# Patient Record
Sex: Male | Born: 1950 | Race: White | Hispanic: No | Marital: Married | Smoking: Never smoker
Health system: Southern US, Community
[De-identification: ages and names within clinical notes are randomized; demographics above are authoritative.]

## PROBLEM LIST (undated history)

## (undated) DIAGNOSIS — M1990 Unspecified osteoarthritis, unspecified site: Secondary | ICD-10-CM

## (undated) DIAGNOSIS — C44310 Basal cell carcinoma of skin of unspecified parts of face: Secondary | ICD-10-CM

## (undated) DIAGNOSIS — C801 Malignant (primary) neoplasm, unspecified: Secondary | ICD-10-CM

## (undated) DIAGNOSIS — R39198 Other difficulties with micturition: Secondary | ICD-10-CM

## (undated) DIAGNOSIS — K759 Inflammatory liver disease, unspecified: Secondary | ICD-10-CM

## (undated) HISTORY — PX: TONSILLECTOMY: SUR1361

## (undated) HISTORY — PX: PILONIDAL CYST EXCISION: SHX744

## (undated) HISTORY — PX: OTHER SURGICAL HISTORY: SHX169

---

## 2008-11-14 ENCOUNTER — Encounter: Admission: RE | Admit: 2008-11-14 | Discharge: 2008-11-14 | Payer: Self-pay | Admitting: Family Medicine

## 2014-09-11 ENCOUNTER — Ambulatory Visit: Payer: Self-pay | Admitting: Orthopedic Surgery

## 2014-09-11 NOTE — Progress Notes (Signed)
Preoperative surgical orders have been place into the Epic hospital system for Lisa Roca on 09/11/2014, 9:34 AM  by Mickel Crow for surgery on 09-28-2014.  Preop Total Hip - Anterior Approach orders including IV Tylenol, and IV Decadron as long as there are no contraindications to the above medications. Arlee Muslim, PA-C

## 2014-09-14 NOTE — Pre-Procedure Instructions (Signed)
Kirk Price  09/14/2014   Your procedure is scheduled on:  Friday, May 27th   Report to [facility] Admitting at 7:30 AM.   Call this number if you have problems the morning of surgery: [phone]   Remember:   Do not eat food or drink liquids after midnight Thursday.   Take these medicines the morning of surgery with A SIP OF WATER: nothing.  You will need to stop taking ANY anti-inflammatories 4-5 days prior to surgery.   Do not wear jewelry-no rings or watches.  Do not wear lotions or colognes. You may NOT wear deodorant the day of surgery.   Men may shave face and neck.   Do not bring valuables to the hospital.  [facility] is not responsible for any belongings or valuables.               Contacts, dentures or bridgework may not be worn into surgery.  Leave suitcase in the car. After surgery it may be brought to your room.  For patients admitted to the hospital, discharge time is determined by your treatment team.              Name and phone number of your driver:             Special Instructions: "Preparing for Surgery" instruction sheet.   Please read over the following fact sheets that you were given: Pain Booklet, Coughing and Deep Breathing, Blood Transfusion Information, MRSA Information and Surgical Site Infection Prevention

## 2014-09-17 ENCOUNTER — Encounter (HOSPITAL_COMMUNITY): Payer: Self-pay

## 2014-09-17 ENCOUNTER — Encounter (HOSPITAL_COMMUNITY)
Admission: RE | Admit: 2014-09-17 | Discharge: 2014-09-17 | Disposition: A | Discharge status: Home / Self Care | Payer: BLUE CROSS/BLUE SHIELD | Source: Ambulatory Visit | Attending: Orthopedic Surgery | Admitting: Orthopedic Surgery

## 2014-09-17 DIAGNOSIS — R001 Bradycardia, unspecified: Secondary | ICD-10-CM | POA: Insufficient documentation

## 2014-09-17 DIAGNOSIS — M1611 Unilateral primary osteoarthritis, right hip: Secondary | ICD-10-CM | POA: Diagnosis not present

## 2014-09-17 DIAGNOSIS — Z01818 Encounter for other preprocedural examination: Secondary | ICD-10-CM | POA: Diagnosis present

## 2014-09-17 DIAGNOSIS — Z01812 Encounter for preprocedural laboratory examination: Secondary | ICD-10-CM | POA: Diagnosis not present

## 2014-09-17 HISTORY — DX: Inflammatory liver disease, unspecified: K75.9

## 2014-09-17 HISTORY — DX: Unspecified osteoarthritis, unspecified site: M19.90

## 2014-09-17 HISTORY — DX: Malignant (primary) neoplasm, unspecified: C80.1

## 2014-09-17 LAB — URINALYSIS, ROUTINE W REFLEX MICROSCOPIC
Bilirubin Urine: NEGATIVE
GLUCOSE, UA: NEGATIVE mg/dL
Hgb urine dipstick: NEGATIVE
Ketones, ur: NEGATIVE mg/dL
LEUKOCYTES UA: NEGATIVE
NITRITE: NEGATIVE
PH: 5 (ref 5.0–8.0)
Protein, ur: NEGATIVE mg/dL
SPECIFIC GRAVITY, URINE: 1.026 (ref 1.005–1.030)
Urobilinogen, UA: 0.2 mg/dL (ref 0.0–1.0)

## 2014-09-17 LAB — CBC
HEMATOCRIT: 47.6 % (ref 39.0–52.0)
HEMOGLOBIN: 15.6 g/dL (ref 13.0–17.0)
MCH: 30.6 pg (ref 26.0–34.0)
MCHC: 32.8 g/dL (ref 30.0–36.0)
MCV: 93.3 fL (ref 78.0–100.0)
Platelets: 253 10*3/uL (ref 150–400)
RBC: 5.1 MIL/uL (ref 4.22–5.81)
RDW: 12.9 % (ref 11.5–15.5)
WBC: 6.6 10*3/uL (ref 4.0–10.5)

## 2014-09-17 LAB — COMPREHENSIVE METABOLIC PANEL
ALBUMIN: 3.9 g/dL (ref 3.5–5.0)
ALK PHOS: 92 U/L (ref 38–126)
ALT: 19 U/L (ref 17–63)
ANION GAP: 10 (ref 5–15)
AST: 21 U/L (ref 15–41)
BUN: 18 mg/dL (ref 6–20)
CALCIUM: 9.5 mg/dL (ref 8.9–10.3)
CO2: 27 mmol/L (ref 22–32)
CREATININE: 1.07 mg/dL (ref 0.61–1.24)
Chloride: 108 mmol/L (ref 101–111)
GFR calc non Af Amer: >60 mL/min (ref >60)
GLUCOSE: 93 mg/dL (ref 65–99)
POTASSIUM: 5 mmol/L (ref 3.5–5.1)
Sodium: 145 mmol/L (ref 135–145)
TOTAL PROTEIN: 6.2 g/dL — AB (ref 6.5–8.1)
Total Bilirubin: 0.5 mg/dL (ref 0.3–1.2)

## 2014-09-17 LAB — PROTIME-INR
INR: 1.02 (ref 0.00–1.49)
Prothrombin Time: 13.5 seconds (ref 11.6–15.2)

## 2014-09-17 LAB — SURGICAL PCR SCREEN
MRSA, PCR: NEGATIVE
Staphylococcus aureus: NEGATIVE

## 2014-09-17 LAB — APTT: APTT: 56 s — AB (ref 24–37)

## 2014-09-17 NOTE — Progress Notes (Addendum)
Patient would prefer NOT to wear blood band x 11 days.  He understands he will get another blood sample draw DOS. Denies any cardiac problems.  Has never seen cardio.  His PCP is Dr. Jetty Duhamel @ [place].   DA

## 2014-09-18 ENCOUNTER — Encounter (HOSPITAL_COMMUNITY): Payer: Self-pay

## 2014-09-18 NOTE — Progress Notes (Signed)
Anesthesia Chart Review:  Pt is 64 year old male scheduled for R total hip arthroplasty on 09/28/2014 with Dr. Wynelle Link.   PMH includes: basal cell skin cancer, arthritis. Never smoker. BMI 28.  Medications include: ibuprofen.   Preoperative labs reviewed. PTT 56. Called and faxed results to [facility] in Dr. Anne Fu office. Will repeat DOS.   EKG: sinus bradycardia (52 bpm)  Carotid duplex US 11/14/2008: Minimal carotid atherosclerosis. No hemodynamically significant ICA stenosis on either side  If lab results acceptable DOS, I anticipate pt can proceed as scheduled.   Willeen Cass, FNP-BC [facility] Short Stay Surgical Center/Anesthesiology Phone: [phone] 09/18/2014 2:51 PM

## 2014-09-21 ENCOUNTER — Ambulatory Visit: Payer: Self-pay | Admitting: Orthopedic Surgery

## 2014-09-21 NOTE — H&P (Signed)
Kirk Price DOB: 08/31/1950 Married / Language: English / Race: White Male Date of Admission:  09/28/2014 CC:  Right Hip Pain History of Present Illness The patient is a 64 year old male who comes in for a preoperative History and Physical. The patient is scheduled for a right total hip arthroplasty (anterior) to be performed by Dr. Dione Plover. Aluisio, MD at [facility] on 09-28-2014. The patient reports right hip problems including pain symptoms that have been present for 4 year(s). The symptoms began without any known injury. Symptoms reported include hip pain (groin pain, constant dull pain, more acute with certain movements) and night pain (with lying in certain positions) Previous treatment for this problem has included corticosteroid injection (intraarticular) and nonsteroidal anti-inflammatory drugs. He said the right hip is starting to hurt with all activities. Occasionally, he will have pain at rest also. It is starting to limit him significantly. 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 Unfortunately, he is not playing tennis anymore. He is having a hard time getting around just doing regular activities. He occasionally will have pain at night. His motion has become very limited. He currently is not having any problems with his left hip, but has been told he has some left hip arthritis. Dr. Delilah Shan has previously given intraarticular cortisone injection, which did not help. He has had some benefit from Mobic in the past, but does not want to take any regular medications. He has advanced end stage arthritis of the right hip with progressively pain and dysfunction. At this point, the most predictable means of improving pain and function will be total hip arthroplasty. He is ready to proceed with surgery. They have been treated conservatively in the past for the above stated problem and despite conservative measures, they continue to have  progressive pain and severe functional limitations and dysfunction. They have failed non-operative management including home exercise, medications, and injections. It is felt that they would benefit from undergoing total joint replacement. Risks and benefits of the procedure have been discussed with the patient and they elect to proceed with surgery. There are no active contraindications to surgery such as ongoing infection or rapidly progressive neurological disease.  Problem List/Past Medical Primary osteoarthritis of right hip (M16.11) Leg pain (M79.606) Skin Cancer Hypercholesterolemia Chronic Pain  Allergies  No Known Drug Allergies  Family History  Cancer Brother, Father.  Social History  Exercise Exercises weekly; does individual sport Current work status working full time Living situation live with spouse Children 2 No history of drug/alcohol rehab Number of flights of stairs before winded 4-5 Not under pain contract Marital status married Current drinker 06/22/2013: Currently drinks beer and wine only occasionally per week Tobacco use Never smoker. 06/22/2013 Tobacco / smoke exposure 06/22/2013: no  Medication History No Current Medications  Past Surgical History Basal Cell - Facial Lesion Removed  Review of Systems General Not Present- Chills, Fatigue, Fever, Memory Loss, Night Sweats, Weight Gain and Weight Loss. Skin Not Present- Eczema, Hives, Itching, Lesions and Rash. HEENT Not Present- Dentures, Double Vision, Headache, Hearing Loss, Tinnitus and Visual Loss. Respiratory Not Present- Allergies, Chronic Cough, Coughing up blood, Shortness of breath at rest and Shortness of breath with exertion. Cardiovascular Not Present- Chest Pain, Difficulty Breathing Lying Down, Murmur, Palpitations, Racing/skipping heartbeats and Swelling. Gastrointestinal Not Present- Abdominal Pain, Bloody Stool, Constipation, Diarrhea, Difficulty Swallowing, Heartburn,  Jaundice, Loss of appetitie, Nausea and Vomiting. Male Genitourinary Present- Urinating at  Night. Not Present- Blood in Urine, Discharge, Flank Pain, Incontinence, Painful Urination, Urgency, Urinary frequency, Urinary Retention and Weak urinary stream. Musculoskeletal Present- Joint Pain. Not Present- Back Pain, Joint Swelling, Morning Stiffness, Muscle Pain, Muscle Weakness and Spasms. Neurological Not Present- Blackout spells, Difficulty with balance, Dizziness, Paralysis, Tremor and Weakness. Psychiatric Not Present- Insomnia.  Vitals Weight: 192 lb Height: 71in Weight was reported by patient. Body Surface Area: 2.07 m Body Mass Index: 26.78 kg/m  BP: 128/78 (Sitting, Right Arm, Standard)   Physical Exam General Mental Status -Alert, cooperative and good historian. General Appearance-pleasant, Not in acute distress. Orientation-Oriented X3. Build & Nutrition-Well nourished and Well developed.  Head and Neck Head-normocephalic, atraumatic . Neck Global Assessment - supple, no bruit auscultated on the right, no bruit auscultated on the left.  Eye Vision-Wears corrective lenses(readers only). Pupil - Bilateral-Regular and Round. Motion - Bilateral-EOMI.  Chest and Lung Exam Auscultation Breath sounds - clear at anterior chest wall and clear at posterior chest wall. Adventitious sounds - No Adventitious sounds.  Cardiovascular Auscultation Rhythm - Regular rate and rhythm. Heart Sounds - S1 WNL and S2 WNL. Murmurs & Other Heart Sounds - Auscultation of the heart reveals - No Murmurs.  Abdomen Palpation/Percussion Tenderness - Abdomen is non-tender to palpation. Rigidity (guarding) - Abdomen is soft. Auscultation Auscultation of the abdomen reveals - Bowel sounds normal.  Male Genitourinary Note: Not done, not pertinent to present illness   Musculoskeletal Note: On exam, he is alert and oriented and in no apparent distress. Evaluation of  his left hip shows flexion to 130, rotation in 30, out 40, and abduction 40 without discomfort. His right hip flexion is 90, no internal rotation, approximately 10 degrees of external rotation, and 20 degrees of abduction. His knee exam is normal bilaterally. Pulses, sensation, and motor are intact bilaterally. He has a slightly antalgic gait pattern.  RADIOGRAPHS: AP pelvis and lateral of the right hip taken in December shows severe bone on bone arthritis with subchondral cystic formation and a small amount of flattening of the femoral head.   Assessment & Plan  Primary osteoarthritis of right hip (M16.11) Note:Surgical Plans: Right Total Hip Replacement - Anterior Approach  Disposition: Home  PCP: Dr. Drema Dallas  IV TXA  Anesthesia Issues: None  Signed electronically by Joelene Millin, III PA-C

## 2014-09-27 MED ORDER — CEFAZOLIN SODIUM-DEXTROSE 2-3 GM-% IV SOLR: 2.0000 g | INTRAVENOUS | Status: DC

## 2014-09-27 MED ORDER — CEFAZOLIN SODIUM-DEXTROSE 2-3 GM-% IV SOLR
2.0000 g | INTRAVENOUS | Status: AC
  Administered 2014-09-28: 2 g via INTRAVENOUS
  Filled 2014-09-27: qty 50

## 2014-09-27 MED ORDER — TRANEXAMIC ACID 1000 MG/10ML IV SOLN
1000.0000 mg | INTRAVENOUS | Status: AC
  Administered 2014-09-28: 1000 mg via INTRAVENOUS
  Filled 2014-09-27: qty 10

## 2014-09-28 ENCOUNTER — Inpatient Hospital Stay (HOSPITAL_COMMUNITY): Payer: BLUE CROSS/BLUE SHIELD

## 2014-09-28 ENCOUNTER — Encounter (HOSPITAL_COMMUNITY): Payer: Self-pay | Admitting: *Deleted

## 2014-09-28 ENCOUNTER — Inpatient Hospital Stay (HOSPITAL_COMMUNITY)
Admission: RE | Admit: 2014-09-28 | Discharge: 2014-09-29 | DRG: 470 | Disposition: A | Discharge status: Home Health | Payer: BLUE CROSS/BLUE SHIELD | Source: Ambulatory Visit | Attending: Orthopedic Surgery | Admitting: Orthopedic Surgery

## 2014-09-28 ENCOUNTER — Inpatient Hospital Stay (HOSPITAL_COMMUNITY): Payer: BLUE CROSS/BLUE SHIELD | Admitting: Emergency Medicine

## 2014-09-28 ENCOUNTER — Inpatient Hospital Stay (HOSPITAL_COMMUNITY): Payer: BLUE CROSS/BLUE SHIELD | Admitting: Anesthesiology

## 2014-09-28 ENCOUNTER — Encounter (HOSPITAL_COMMUNITY)
Admission: RE | Disposition: A | Discharge status: Home Health | Payer: Self-pay | Source: Ambulatory Visit | Attending: Orthopedic Surgery

## 2014-09-28 DIAGNOSIS — M1611 Unilateral primary osteoarthritis, right hip: Secondary | ICD-10-CM | POA: Diagnosis present

## 2014-09-28 DIAGNOSIS — E780 Pure hypercholesterolemia: Secondary | ICD-10-CM | POA: Diagnosis present

## 2014-09-28 DIAGNOSIS — Z7902 Long term (current) use of antithrombotics/antiplatelets: Secondary | ICD-10-CM | POA: Diagnosis not present

## 2014-09-28 DIAGNOSIS — M25551 Pain in right hip: Secondary | ICD-10-CM | POA: Diagnosis present

## 2014-09-28 DIAGNOSIS — Z79899 Other long term (current) drug therapy: Secondary | ICD-10-CM

## 2014-09-28 DIAGNOSIS — Z419 Encounter for procedure for purposes other than remedying health state, unspecified: Secondary | ICD-10-CM

## 2014-09-28 DIAGNOSIS — R339 Retention of urine, unspecified: Secondary | ICD-10-CM | POA: Diagnosis not present

## 2014-09-28 DIAGNOSIS — M169 Osteoarthritis of hip, unspecified: Secondary | ICD-10-CM | POA: Diagnosis present

## 2014-09-28 DIAGNOSIS — Z96649 Presence of unspecified artificial hip joint: Secondary | ICD-10-CM

## 2014-09-28 HISTORY — PX: TOTAL HIP ARTHROPLASTY: SHX124

## 2014-09-28 LAB — TYPE AND SCREEN
ABO/RH(D): O POS
Antibody Screen: NEGATIVE

## 2014-09-28 LAB — ABO/RH: ABO/RH(D): O POS

## 2014-09-28 LAB — APTT: aPTT: 78 seconds — ABNORMAL HIGH (ref 24–37)

## 2014-09-28 SURGERY — ARTHROPLASTY, HIP, TOTAL, ANTERIOR APPROACH
Anesthesia: Spinal | Site: Hip | Laterality: Right

## 2014-09-28 MED ORDER — BUPIVACAINE HCL (PF) 0.25 % IJ SOLN
INTRAMUSCULAR | Status: DC | PRN
  Administered 2014-09-28: 30 mL

## 2014-09-28 MED ORDER — OXYCODONE HCL 5 MG PO TABS: 5.0000 mg | ORAL_TABLET | ORAL | Status: DC | PRN

## 2014-09-28 MED ORDER — BUPIVACAINE HCL (PF) 0.25 % IJ SOLN
INTRAMUSCULAR | Status: AC
  Filled 2014-09-28: qty 30

## 2014-09-28 MED ORDER — DIPHENHYDRAMINE HCL 12.5 MG/5ML PO ELIX: 12.5000 mg | ORAL_SOLUTION | ORAL | Status: DC | PRN

## 2014-09-28 MED ORDER — MIDAZOLAM HCL 2 MG/2ML IJ SOLN
INTRAMUSCULAR | Status: AC
  Filled 2014-09-28: qty 2

## 2014-09-28 MED ORDER — ACETAMINOPHEN 10 MG/ML IV SOLN
1000.0000 mg | Freq: Once | INTRAVENOUS | Status: AC
  Administered 2014-09-28: 1000 mg via INTRAVENOUS
  Filled 2014-09-28 (×2): qty 100

## 2014-09-28 MED ORDER — GLYCOPYRROLATE 0.2 MG/ML IJ SOLN
INTRAMUSCULAR | Status: DC | PRN
  Administered 2014-09-28: 0.2 mg via INTRAVENOUS

## 2014-09-28 MED ORDER — PROPOFOL INFUSION 10 MG/ML OPTIME
INTRAVENOUS | Status: DC | PRN
  Administered 2014-09-28: 11:00:00 via INTRAVENOUS
  Administered 2014-09-28: 75 ug/kg/min via INTRAVENOUS

## 2014-09-28 MED ORDER — FENTANYL CITRATE (PF) 250 MCG/5ML IJ SOLN
INTRAMUSCULAR | Status: AC
  Filled 2014-09-28: qty 5

## 2014-09-28 MED ORDER — PROPOFOL 10 MG/ML IV BOLUS
INTRAVENOUS | Status: AC
  Filled 2014-09-28: qty 20

## 2014-09-28 MED ORDER — EPHEDRINE SULFATE 50 MG/ML IJ SOLN
INTRAMUSCULAR | Status: AC
  Filled 2014-09-28: qty 1

## 2014-09-28 MED ORDER — TRAMADOL HCL 50 MG PO TABS: 50.0000 mg | ORAL_TABLET | Freq: Four times a day (QID) | ORAL | Status: DC | PRN

## 2014-09-28 MED ORDER — CEFAZOLIN SODIUM-DEXTROSE 2-3 GM-% IV SOLR
2.0000 g | Freq: Four times a day (QID) | INTRAVENOUS | Status: AC
  Administered 2014-09-28 – 2014-09-29 (×2): 2 g via INTRAVENOUS
  Filled 2014-09-28 (×3): qty 50

## 2014-09-28 MED ORDER — SUCCINYLCHOLINE CHLORIDE 20 MG/ML IJ SOLN
INTRAMUSCULAR | Status: AC
  Filled 2014-09-28: qty 1

## 2014-09-28 MED ORDER — LIDOCAINE HCL (CARDIAC) 20 MG/ML IV SOLN
INTRAVENOUS | Status: AC
  Filled 2014-09-28: qty 5

## 2014-09-28 MED ORDER — ONDANSETRON HCL 4 MG/2ML IJ SOLN
INTRAMUSCULAR | Status: AC
  Filled 2014-09-28: qty 2

## 2014-09-28 MED ORDER — METOCLOPRAMIDE HCL 5 MG PO TABS: 5.0000 mg | ORAL_TABLET | Freq: Three times a day (TID) | ORAL | Status: DC | PRN

## 2014-09-28 MED ORDER — METHOCARBAMOL 1000 MG/10ML IJ SOLN: 500.0000 mg | Freq: Four times a day (QID) | INTRAVENOUS | Status: DC | PRN

## 2014-09-28 MED ORDER — DEXAMETHASONE SODIUM PHOSPHATE 10 MG/ML IJ SOLN
10.0000 mg | Freq: Once | INTRAMUSCULAR | Status: AC
  Administered 2014-09-28: 10 mg via INTRAVENOUS
  Filled 2014-09-28: qty 1

## 2014-09-28 MED ORDER — BUPIVACAINE IN DEXTROSE 0.75-8.25 % IT SOLN
INTRATHECAL | Status: DC | PRN
  Administered 2014-09-28: 15 mg via INTRATHECAL

## 2014-09-28 MED ORDER — ONDANSETRON HCL 4 MG/2ML IJ SOLN
INTRAMUSCULAR | Status: DC | PRN
  Administered 2014-09-28: 4 mg via INTRAVENOUS

## 2014-09-28 MED ORDER — PHENYLEPHRINE HCL 10 MG/ML IJ SOLN
INTRAMUSCULAR | Status: DC | PRN
  Administered 2014-09-28 (×2): 80 ug via INTRAVENOUS

## 2014-09-28 MED ORDER — RIVAROXABAN 10 MG PO TABS
10.0000 mg | ORAL_TABLET | Freq: Every day | ORAL | Status: DC
  Administered 2014-09-29: 10 mg via ORAL
  Filled 2014-09-28: qty 1

## 2014-09-28 MED ORDER — METHOCARBAMOL 500 MG PO TABS: 500.0000 mg | ORAL_TABLET | Freq: Four times a day (QID) | ORAL | Status: DC | PRN

## 2014-09-28 MED ORDER — ONDANSETRON HCL 4 MG/2ML IJ SOLN: 4.0000 mg | Freq: Four times a day (QID) | INTRAMUSCULAR | Status: DC | PRN

## 2014-09-28 MED ORDER — SODIUM CHLORIDE 0.9 % IV SOLN: INTRAVENOUS | Status: DC

## 2014-09-28 MED ORDER — ACETAMINOPHEN 500 MG PO TABS
1000.0000 mg | ORAL_TABLET | Freq: Four times a day (QID) | ORAL | Status: AC
  Administered 2014-09-28 – 2014-09-29 (×4): 1000 mg via ORAL
  Filled 2014-09-28 (×4): qty 2

## 2014-09-28 MED ORDER — POLYETHYLENE GLYCOL 3350 17 G PO PACK: 17.0000 g | PACK | Freq: Every day | ORAL | Status: DC | PRN

## 2014-09-28 MED ORDER — OXYCODONE HCL 5 MG PO TABS
5.0000 mg | ORAL_TABLET | ORAL | Status: DC | PRN
  Filled 2014-09-28: qty 2

## 2014-09-28 MED ORDER — PHENOL 1.4 % MT LIQD: 1.0000 | OROMUCOSAL | Status: DC | PRN

## 2014-09-28 MED ORDER — PROPOFOL 10 MG/ML IV BOLUS
INTRAVENOUS | Status: DC | PRN
  Administered 2014-09-28: 20 mg via INTRAVENOUS

## 2014-09-28 MED ORDER — DEXAMETHASONE SODIUM PHOSPHATE 10 MG/ML IJ SOLN
10.0000 mg | Freq: Once | INTRAMUSCULAR | Status: AC
  Administered 2014-09-29: 10 mg via INTRAVENOUS
  Filled 2014-09-28: qty 1

## 2014-09-28 MED ORDER — ACETAMINOPHEN 325 MG PO TABS: 650.0000 mg | ORAL_TABLET | Freq: Four times a day (QID) | ORAL | Status: DC | PRN

## 2014-09-28 MED ORDER — EPHEDRINE SULFATE 50 MG/ML IJ SOLN
INTRAMUSCULAR | Status: DC | PRN
  Administered 2014-09-28 (×5): 5 mg via INTRAVENOUS
  Administered 2014-09-28: 10 mg via INTRAVENOUS

## 2014-09-28 MED ORDER — ARTIFICIAL TEARS OP OINT
TOPICAL_OINTMENT | OPHTHALMIC | Status: AC
  Filled 2014-09-28: qty 3.5

## 2014-09-28 MED ORDER — METOCLOPRAMIDE HCL 5 MG/ML IJ SOLN: 5.0000 mg | Freq: Three times a day (TID) | INTRAMUSCULAR | Status: DC | PRN

## 2014-09-28 MED ORDER — MIDAZOLAM HCL 5 MG/5ML IJ SOLN
INTRAMUSCULAR | Status: DC | PRN
  Administered 2014-09-28: 2 mg via INTRAVENOUS

## 2014-09-28 MED ORDER — MORPHINE SULFATE 2 MG/ML IJ SOLN
1.0000 mg | INTRAMUSCULAR | Status: DC | PRN
  Administered 2014-09-28: 1 mg via INTRAVENOUS
  Filled 2014-09-28: qty 1

## 2014-09-28 MED ORDER — RIVAROXABAN 10 MG PO TABS: 10.0000 mg | ORAL_TABLET | Freq: Every day | ORAL | Status: DC

## 2014-09-28 MED ORDER — DEXTROSE-NACL 5-0.45 % IV SOLN
INTRAVENOUS | Status: DC
  Administered 2014-09-28: 100 mL/h via INTRAVENOUS

## 2014-09-28 MED ORDER — MENTHOL 3 MG MT LOZG: 1.0000 | LOZENGE | OROMUCOSAL | Status: DC | PRN

## 2014-09-28 MED ORDER — ONDANSETRON HCL 4 MG PO TABS: 4.0000 mg | ORAL_TABLET | Freq: Four times a day (QID) | ORAL | Status: DC | PRN

## 2014-09-28 MED ORDER — 0.9 % SODIUM CHLORIDE (POUR BTL) OPTIME
TOPICAL | Status: DC | PRN
  Administered 2014-09-28: 1000 mL

## 2014-09-28 MED ORDER — ROCURONIUM BROMIDE 50 MG/5ML IV SOLN
INTRAVENOUS | Status: AC
  Filled 2014-09-28: qty 1

## 2014-09-28 MED ORDER — DOCUSATE SODIUM 100 MG PO CAPS
100.0000 mg | ORAL_CAPSULE | Freq: Two times a day (BID) | ORAL | Status: DC
  Administered 2014-09-28 – 2014-09-29 (×3): 100 mg via ORAL
  Filled 2014-09-28 (×3): qty 1

## 2014-09-28 MED ORDER — FLEET ENEMA 7-19 GM/118ML RE ENEM
1.0000 | ENEMA | Freq: Once | RECTAL | Status: AC | PRN
Start: 2014-09-28 — End: 2014-09-28

## 2014-09-28 MED ORDER — CHLORHEXIDINE GLUCONATE 4 % EX LIQD: 60.0000 mL | Freq: Once | CUTANEOUS | Status: DC

## 2014-09-28 MED ORDER — ACETAMINOPHEN 650 MG RE SUPP: 650.0000 mg | Freq: Four times a day (QID) | RECTAL | Status: DC | PRN

## 2014-09-28 MED ORDER — KETOROLAC TROMETHAMINE 15 MG/ML IJ SOLN: 7.5000 mg | Freq: Four times a day (QID) | INTRAMUSCULAR | Status: DC | PRN

## 2014-09-28 MED ORDER — LACTATED RINGERS IV SOLN
INTRAVENOUS | Status: DC
  Administered 2014-09-28 (×2): via INTRAVENOUS

## 2014-09-28 MED ORDER — STERILE WATER FOR INJECTION IJ SOLN
INTRAMUSCULAR | Status: AC
  Filled 2014-09-28: qty 10

## 2014-09-28 MED ORDER — FENTANYL CITRATE (PF) 100 MCG/2ML IJ SOLN
INTRAMUSCULAR | Status: DC | PRN
  Administered 2014-09-28: 100 ug via INTRAVENOUS
  Administered 2014-09-28 (×2): 50 ug via INTRAVENOUS

## 2014-09-28 MED ORDER — BISACODYL 10 MG RE SUPP: 10.0000 mg | Freq: Every day | RECTAL | Status: DC | PRN

## 2014-09-28 SURGICAL SUPPLY — 41 items
BLADE SAW SGTL 18X1.27X75 (BLADE) ×2 IMPLANT
BLADE SAW SGTL 18X1.27X75MM (BLADE) ×1
CAPT HIP TOTAL 2 ×3 IMPLANT
CLOSURE STERI-STRIP 1/2X4 (GAUZE/BANDAGES/DRESSINGS) ×1
CLSR STERI-STRIP ANTIMIC 1/2X4 (GAUZE/BANDAGES/DRESSINGS) ×2 IMPLANT
DECANTER SPIKE VIAL GLASS SM (MISCELLANEOUS) ×3 IMPLANT
DRAPE C-ARM 42X72 X-RAY (DRAPES) ×3 IMPLANT
DRAPE IMP U-DRAPE 54X76 (DRAPES) ×3 IMPLANT
DRAPE STERI IOBAN 125X83 (DRAPES) ×3 IMPLANT
DRAPE U-SHAPE 47X51 STRL (DRAPES) ×9 IMPLANT
DRSG MEPILEX BORDER 4X4 (GAUZE/BANDAGES/DRESSINGS) ×3 IMPLANT
DRSG MEPILEX BORDER 4X8 (GAUZE/BANDAGES/DRESSINGS) ×3 IMPLANT
DURAPREP 26ML APPLICATOR (WOUND CARE) ×3 IMPLANT
ELECT BLADE 6.5 EXT (BLADE) ×3 IMPLANT
ELECT REM PT RETURN 9FT ADLT (ELECTROSURGICAL) ×3
ELECTRODE REM PT RTRN 9FT ADLT (ELECTROSURGICAL) ×1 IMPLANT
EVACUATOR 1/8 PVC DRAIN (DRAIN) ×3 IMPLANT
FACESHIELD WRAPAROUND (MASK) ×6 IMPLANT
GLOVE BIO SURGEON STRL SZ7.5 (GLOVE) ×3 IMPLANT
GLOVE BIO SURGEON STRL SZ8 (GLOVE) ×3 IMPLANT
GLOVE BIOGEL PI IND STRL 8 (GLOVE) ×2 IMPLANT
GLOVE BIOGEL PI INDICATOR 8 (GLOVE) ×4
GOWN STRL REUS W/ TWL LRG LVL3 (GOWN DISPOSABLE) ×1 IMPLANT
GOWN STRL REUS W/ TWL XL LVL3 (GOWN DISPOSABLE) ×1 IMPLANT
GOWN STRL REUS W/TWL LRG LVL3 (GOWN DISPOSABLE) ×3
GOWN STRL REUS W/TWL XL LVL3 (GOWN DISPOSABLE) ×2
KIT BASIN OR (CUSTOM PROCEDURE TRAY) ×3 IMPLANT
NDL SAFETY ECLIPSE 18X1.5 (NEEDLE) ×1 IMPLANT
NEEDLE HYPO 18GX1.5 SHARP (NEEDLE) ×3
PACK TOTAL JOINT (CUSTOM PROCEDURE TRAY) ×3 IMPLANT
PACK UNIVERSAL I (CUSTOM PROCEDURE TRAY) ×3 IMPLANT
SUT ETHIBOND NAB CT1 #1 30IN (SUTURE) ×3 IMPLANT
SUT MNCRL AB 4-0 PS2 18 (SUTURE) ×3 IMPLANT
SUT VIC AB 1 CT1 27 (SUTURE) ×2
SUT VIC AB 1 CT1 27XBRD ANTBC (SUTURE) ×1 IMPLANT
SUT VIC AB 2-0 CT1 27 (SUTURE) ×4
SUT VIC AB 2-0 CT1 TAPERPNT 27 (SUTURE) ×2 IMPLANT
SUT VLOC 180 0 24IN GS25 (SUTURE) ×3 IMPLANT
SYR CONTROL 10ML LL (SYRINGE) ×3 IMPLANT
TOWEL OR 17X26 10 PK STRL BLUE (TOWEL DISPOSABLE) ×6 IMPLANT
TRAY FOLEY CATH 16FR SILVER (SET/KITS/TRAYS/PACK) ×3 IMPLANT

## 2014-09-28 NOTE — Op Note (Signed)
OPERATIVE REPORT  PREOPERATIVE DIAGNOSIS: Osteoarthritis of the Right hip.   POSTOPERATIVE DIAGNOSIS: Osteoarthritis of the Right  hip.   PROCEDURE: Right total hip arthroplasty, anterior approach.   SURGEON: Gaynelle Arabian, MD   ASSISTANT: Arlee Muslim, PA-C  ANESTHESIA:  Spinal  ESTIMATED BLOOD LOSS:-350 ml    DRAINS: Hemovac x1.   COMPLICATIONS: None   CONDITION: PACU - hemodynamically stable.   BRIEF CLINICAL NOTE: Kirk Price is a 64 y.o. male who has advanced end-  stage arthritis of his Right  hip with progressively worsening pain and  dysfunction.The patient has failed nonoperative management and presents for  total hip arthroplasty.   PROCEDURE IN DETAIL: After successful administration of spinal  anesthetic, the traction boots for the [facility] bed were placed on both  feet and the patient was placed onto the [facility] bed, boots placed into the leg  holders. The Right hip was then isolated from the perineum with plastic  drapes and prepped and draped in the usual sterile fashion. ASIS and  greater trochanter were marked and a oblique incision was made, starting  at about 1 cm lateral and 2 cm distal to the ASIS and coursing towards  the anterior cortex of the femur. The skin was cut with a 10 blade  through subcutaneous tissue to the level of the fascia overlying the  tensor fascia lata muscle. The fascia was then incised in line with the  incision at the junction of the anterior third and posterior 2/3rd. The  muscle was teased off the fascia and then the interval between the TFL  and the rectus was developed. The Hohmann retractor was then placed at  the top of the femoral neck over the capsule. The vessels overlying the  capsule were cauterized and the fat on top of the capsule was removed.  A Hohmann retractor was then placed anterior underneath the rectus  femoris to give exposure to the entire anterior capsule. A T-shaped  capsulotomy was performed. The  edges were tagged and the femoral head  was identified.       Osteophytes are removed off the superior acetabulum.  The femoral neck was then cut in situ with an oscillating saw. Traction  was then applied to the left lower extremity utilizing the [facility]  traction. The femoral head was then removed. Retractors were placed  around the acetabulum and then circumferential removal of the labrum was  performed. Osteophytes were also removed. Reaming starts at 47 mm to  medialize and  Increased in 2 mm increments to 53 mm. We reamed in  approximately 40 degrees of abduction, 20 degrees anteversion. A 54 mm  pinnacle acetabular shell was then impacted in anatomic position under  fluoroscopic guidance with excellent purchase. We did not need to place  any additional dome screws. A 36 mm neutral + 4 marathon liner was then  placed into the acetabular shell.       The femoral lift was then placed along the lateral aspect of the femur  just distal to the vastus ridge. The leg was  externally rotated and capsule  was stripped off the inferior aspect of the femoral neck down to the  level of the lesser trochanter, this was done with electrocautery. The femur was lifted after this was performed. The  leg was then placed and extended in adducted position to essentially delivering the femur. We also removed the capsule superiorly and the  piriformis from the piriformis  fossa to gain excellent exposure of the  proximal femur. Rongeur was used to remove some cancellous bone to get  into the lateral portion of the proximal femur for placement of the  initial starter reamer. The starter broaches was placed  the starter broach  and was shown to go down the center of the canal. Broaching  with the  Corail system was then performed starting at size 8, coursing  Up to size 13. A size 13 had excellent torsional and rotational  and axial stability. The trial standard offset neck was then placed  with a 36 + 5 trial  head. The hip was then reduced. We confirmed that  the stem was in the canal both on AP and lateral x-rays. It also has excellent sizing. The hip was reduced with outstanding stability through full extension, full external rotation,  and then flexion in adduction internal rotation. AP pelvis was taken  and the leg lengths were measured and found to be exactly equal. Hip  was then dislocated again and the femoral head and neck removed. The  femoral broach was removed. Size 13 Corail stem with a standard offset  neck was then impacted into the femur following native anteversion. Has  excellent purchase in the canal. Excellent torsional and rotational and  axial stability. It is confirmed to be in the canal on AP and lateral  fluoroscopic views. The 36 + 5 ceramic head was placed and the hip  reduced with outstanding stability. Again AP pelvis was taken and it  confirmed that the leg lengths were equal. The wound was then copiously  irrigated with saline solution and the capsule reattached and repaired  with Ethibond suture.  30 ml of .25% Bupivicaine injected into the capsule and into the edge of the tensor fascia lata as well as subcutaneous tissue. The fascia overlying the tensor fascia lata was  then closed with a running #1 V-Loc. Subcu was closed with interrupted  2-0 Vicryl and subcuticular running 4-0 Monocryl. Incision was cleaned  and dried. Steri-Strips and a bulky sterile dressing applied. Hemovac  drain was hooked to suction and then he was awakened and transported to  recovery in stable condition.        Please note that a surgical assistant was a medical necessity for this procedure to perform it in a safe and expeditious manner. Assistant was necessary to provide appropriate retraction of vital neurovascular structures and to prevent femoral fracture and allow for anatomic placement of the prosthesis.  Gaynelle Arabian, M.D.

## 2014-09-28 NOTE — Interval H&P Note (Signed)
History and Physical Interval Note:  09/28/2014 9:09 AM  Kirk Price  has presented today for surgery, with the diagnosis of OA RIGHT HIP  The various methods of treatment have been discussed with the patient and family. After consideration of risks, benefits and other options for treatment, the patient has consented to  Procedure(s): RIGHT TOTAL HIP ARTHROPLASTY ANTERIOR APPROACH (Right) as a surgical intervention .  The patient's history has been reviewed, patient examined, no change in status, stable for surgery.  I have reviewed the patient's chart and labs.  Questions were answered to the patient's satisfaction.     Gearlean Alf

## 2014-09-28 NOTE — Discharge Instructions (Signed)
Dr. Gaynelle Arabian Total Joint Specialist [facility] [address] [place], [place] [address] [phone]    ANTERIOR APPROACH TOTAL HIP REPLACEMENT POSTOPERATIVE DIRECTIONS   Hip Rehabilitation, Guidelines Following Surgery  The results of a hip operation are greatly improved after range of motion and muscle strengthening exercises. Follow all safety measures which are given to protect your hip. If any of these exercises cause increased pain or swelling in your joint, decrease the amount until you are comfortable again. Then slowly increase the exercises. Call your caregiver if you have problems or questions.  HOME CARE INSTRUCTIONS  Most of the following instructions are designed to prevent the dislocation of your new hip.  Remove items at home which could result in a fall. This includes throw rugs or furniture in walking pathways.  Continue medications as instructed at time of discharge.  You may have some home medications which will be placed on hold until you complete the course of blood thinner medication.  You may start showering on 5/30 but do not submerge the incision under water. Just pat the incision dry and apply a dry gauze dressing on daily. Do not put on socks or shoes without following the instructions of your caregivers.  Sit on high chairs which makes it easier to stand.  Sit on chairs with arms. Use the chair arms to help push yourself up when arising.  Keep your leg on the side of the operation out in front of you when standing up.  Arrange for the use of a toilet seat elevator so you are not sitting low.    Walk with walker as instructed.  You may resume a sexual relationship in one month or when given the OK by your caregiver.  Use walker as long as suggested by your caregivers.  Other:  Weight bearing as tolerated with walker. [place] to cane with Physical Therapy Avoid periods of inactivity such as sitting longer than an hour when not  asleep. This helps prevent blood clots.  You may return to work once you are cleared by Engineer, production.  Do not drive a car for 6 weeks or until released by your surgeon.  Do not drive while taking narcotics.  Wear elastic stockings for three weeks following surgery during the day but you may remove then at night.  Make sure you keep all of your appointments after your operation with all of your doctors and caregivers. You should call the office at the above phone number and make an appointment for approximately two weeks after the date of your surgery. Change the dressing daily and reapply a dry dressing each time. Please pick up a stool softener and laxative for home use as long as you are requiring pain medications.  ICE to the affected hip every three hours for 30 minutes at a time and then as needed for pain and swelling.  Continue to use ice on the hip for pain and swelling from surgery. You may notice swelling that will progress down to the foot and ankle.  This is normal after surgery.  Elevate the leg when you are not up walking on it.   It is important for you to complete the blood thinner medication as prescribed by your doctor.  Continue to use the breathing machine which will help keep your temperature down.  It is common for your temperature to cycle up and down following surgery, especially at night when you are not up moving around and exerting yourself.  The breathing machine keeps your lungs expanded and your temperature down.  RANGE OF MOTION AND STRENGTHENING EXERCISES  These exercises are designed to help you keep full movement of your hip joint. Follow your caregiver's or physical therapist's instructions. Perform all exercises about fifteen times, three times per day or as directed. Exercise both hips, even if you have had only one joint replacement. These exercises can be done on a training (exercise) mat, on the floor, on a table or on a bed. Use whatever works the best and is  most comfortable for you. Use music or television while you are exercising so that the exercises are a pleasant break in your day. This will make your life better with the exercises acting as a break in routine you can look forward to.  Lying on your back, slowly slide your foot toward your buttocks, raising your knee up off the floor. Then slowly slide your foot back down until your leg is straight again.  Lying on your back spread your legs as far apart as you can without causing discomfort.  Lying on your side, raise your upper leg and foot straight up from the floor as far as is comfortable. Slowly lower the leg and repeat.  Lying on your back, tighten up the muscle in the front of your thigh (quadriceps muscles). You can do this by keeping your leg straight and trying to raise your heel off the floor. This helps strengthen the largest muscle supporting your knee.  Lying on your back, tighten up the muscles of your buttocks both with the legs straight and with the knee bent at a comfortable angle while keeping your heel on the floor.   SKILLED REHAB INSTRUCTIONS: If the patient is transferred to a skilled rehab facility following release from the hospital, a list of the current medications will be sent to the facility for the patient to continue.  When discharged from the skilled rehab facility, please have the facility set up the patient's [place] prior to being released. Also, the skilled facility will be responsible for providing the patient with their medications at time of release from the facility to include their pain medication, the muscle relaxants, and their blood thinner medication. If the patient is still at the rehab facility at time of the two week follow up appointment, the skilled rehab facility will also need to assist the patient in arranging follow up appointment in our office and any transportation needs.  MAKE SURE YOU:  Understand these instructions.  Will  watch your condition.  Will get help right away if you are not doing well or get worse.  Pick up stool softner and laxative for home use following surgery while on pain medications. Do not submerge incision under water. Please use good hand washing techniques while changing dressing each day. May shower starting three days after surgery. Please use a clean towel to pat the incision dry following showers. Continue to use ice for pain and swelling after surgery. Do not use any lotions or creams on the incision until instructed by your surgeon. Total Hip Protocol.

## 2014-09-28 NOTE — H&P (View-Only) (Signed)
Kirk Price. Kirk Price DOB: 02/25/1951 Married / Language: English / Race: White Male Date of Admission:  09/28/2014 CC:  Right Hip Pain History of Present Illness The patient is a 64 year old male who comes in for a preoperative History and Physical. The patient is scheduled for a right total hip arthroplasty (anterior) to be performed by Dr. Dione Plover. Aluisio, MD at [facility] on 09-28-2014. The patient reports right hip problems including pain symptoms that have been present for 4 year(s). The symptoms began without any known injury. Symptoms reported include hip pain (groin pain, constant dull pain, more acute with certain movements) and night pain (with lying in certain positions) Previous treatment for this problem has included corticosteroid injection (intraarticular) and nonsteroidal anti-inflammatory drugs. He said the right hip is starting to hurt with all activities. Occasionally, he will have pain at rest also. It is starting to limit him significantly. 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 Unfortunately, he is not playing tennis anymore. He is having a hard time getting around just doing regular activities. He occasionally will have pain at night. His motion has become very limited. He currently is not having any problems with his left hip, but has been told he has some left hip arthritis. Dr. Delilah Shan has previously given intraarticular cortisone injection, which did not help. He has had some benefit from Mobic in the past, but does not want to take any regular medications. He has advanced end stage arthritis of the right hip with progressively pain and dysfunction. At this point, the most predictable means of improving pain and function will be total hip arthroplasty. He is ready to proceed with surgery. They have been treated conservatively in the past for the above stated problem and despite conservative measures, they continue to have  progressive pain and severe functional limitations and dysfunction. They have failed non-operative management including home exercise, medications, and injections. It is felt that they would benefit from undergoing total joint replacement. Risks and benefits of the procedure have been discussed with the patient and they elect to proceed with surgery. There are no active contraindications to surgery such as ongoing infection or rapidly progressive neurological disease.  Problem List/Past Medical Primary osteoarthritis of right hip (M16.11) Leg pain (M79.606) Skin Cancer Hypercholesterolemia Chronic Pain  Allergies  No Known Drug Allergies  Family History  Cancer Brother, Father.  Social History  Exercise Exercises weekly; does individual sport Current work status working full time Living situation live with spouse Children 2 No history of drug/alcohol rehab Number of flights of stairs before winded 4-5 Not under pain contract Marital status married Current drinker 06/22/2013: Currently drinks beer and wine only occasionally per week Tobacco use Never smoker. 06/22/2013 Tobacco / smoke exposure 06/22/2013: no  Medication History No Current Medications  Past Surgical History Basal Cell - Facial Lesion Removed  Review of Systems General Not Present- Chills, Fatigue, Fever, Memory Loss, Night Sweats, Weight Gain and Weight Loss. Skin Not Present- Eczema, Hives, Itching, Lesions and Rash. HEENT Not Present- Dentures, Double Vision, Headache, Hearing Loss, Tinnitus and Visual Loss. Respiratory Not Present- Allergies, Chronic Cough, Coughing up blood, Shortness of breath at rest and Shortness of breath with exertion. Cardiovascular Not Present- Chest Pain, Difficulty Breathing Lying Down, Murmur, Palpitations, Racing/skipping heartbeats and Swelling. Gastrointestinal Not Present- Abdominal Pain, Bloody Stool, Constipation, Diarrhea, Difficulty Swallowing, Heartburn,  Jaundice, Loss of appetitie, Nausea and Vomiting. Male Genitourinary Present- Urinating at  Night. Not Present- Blood in Urine, Discharge, Flank Pain, Incontinence, Painful Urination, Urgency, Urinary frequency, Urinary Retention and Weak urinary stream. Musculoskeletal Present- Joint Pain. Not Present- Back Pain, Joint Swelling, Morning Stiffness, Muscle Pain, Muscle Weakness and Spasms. Neurological Not Present- Blackout spells, Difficulty with balance, Dizziness, Paralysis, Tremor and Weakness. Psychiatric Not Present- Insomnia.  Vitals Weight: 192 lb Height: 71in Weight was reported by patient. Body Surface Area: 2.07 m Body Mass Index: 26.78 kg/m  BP: 128/78 (Sitting, Right Arm, Standard)   Physical Exam General Mental Status -Alert, cooperative and good historian. General Appearance-pleasant, Not in acute distress. Orientation-Oriented X3. Build & Nutrition-Well nourished and Well developed.  Head and Neck Head-normocephalic, atraumatic . Neck Global Assessment - supple, no bruit auscultated on the right, no bruit auscultated on the left.  Eye Vision-Wears corrective lenses(readers only). Pupil - Bilateral-Regular and Round. Motion - Bilateral-EOMI.  Chest and Lung Exam Auscultation Breath sounds - clear at anterior chest wall and clear at posterior chest wall. Adventitious sounds - No Adventitious sounds.  Cardiovascular Auscultation Rhythm - Regular rate and rhythm. Heart Sounds - S1 WNL and S2 WNL. Murmurs & Other Heart Sounds - Auscultation of the heart reveals - No Murmurs.  Abdomen Palpation/Percussion Tenderness - Abdomen is non-tender to palpation. Rigidity (guarding) - Abdomen is soft. Auscultation Auscultation of the abdomen reveals - Bowel sounds normal.  Male Genitourinary Note: Not done, not pertinent to present illness   Musculoskeletal Note: On exam, he is alert and oriented and in no apparent distress. Evaluation of  his left hip shows flexion to 130, rotation in 30, out 40, and abduction 40 without discomfort. His right hip flexion is 90, no internal rotation, approximately 10 degrees of external rotation, and 20 degrees of abduction. His knee exam is normal bilaterally. Pulses, sensation, and motor are intact bilaterally. He has a slightly antalgic gait pattern.  RADIOGRAPHS: AP pelvis and lateral of the right hip taken in December shows severe bone on bone arthritis with subchondral cystic formation and a small amount of flattening of the femoral head.   Assessment & Plan  Primary osteoarthritis of right hip (M16.11) Note:Surgical Plans: Right Total Hip Replacement - Anterior Approach  Disposition: Home  PCP: Dr. Drema Dallas  IV TXA  Anesthesia Issues: None  Signed electronically by Joelene Millin, III PA-C

## 2014-09-28 NOTE — Anesthesia Postprocedure Evaluation (Signed)
  Anesthesia Post-op Note  Patient: Kirk Price  Procedure(s) Performed: Procedure(s): RIGHT TOTAL HIP ARTHROPLASTY ANTERIOR APPROACH (Right)  Patient Location: PACU  Anesthesia Type:Spinal  Level of Consciousness: awake and alert   Airway and Oxygen Therapy: Patient Spontanous Breathing  Post-op Pain: none  Post-op Assessment: Post-op Vital signs reviewed, Respiratory Function Stable and Pain level controlled  Post-op Vital Signs: stable  Last Vitals:  Filed Vitals:   09/28/14 1145  BP: 115/72  Pulse: 56  Temp:   Resp: 9    Complications: No apparent anesthesia complications

## 2014-09-28 NOTE — Anesthesia Procedure Notes (Signed)
Spinal Patient location during procedure: OR Start time: 09/28/2014 9:50 AM End time: 09/28/2014 9:56 AM Staffing Anesthesiologist: Haruko Mersch Performed by: anesthesiologist  Preanesthetic Checklist Completed: patient identified, site marked, surgical consent, pre-op evaluation, timeout performed, IV checked, risks and benefits discussed and monitors and equipment checked Spinal Block Patient position: sitting Prep: Betadine Patient monitoring: heart rate, cardiac monitor, continuous pulse ox and blood pressure Approach: midline Location: L3-4 Injection technique: single-shot Needle Needle type: Quincke  Needle gauge: 25 G Needle length: 5 cm Needle insertion depth: 3 cm Assessment Sensory level: T8 Additional Notes Tolerated well

## 2014-09-28 NOTE — Anesthesia Preprocedure Evaluation (Addendum)
Anesthesia Evaluation  Patient identified by MRN, date of birth, ID band Patient awake    History of Anesthesia Complications Negative for: history of anesthetic complications  Airway Mallampati: I  TM Distance: >3 FB Neck ROM: Full    Dental  (+) Teeth Intact   Pulmonary neg pulmonary ROS,  breath sounds clear to auscultation        Cardiovascular negative cardio ROS  Rhythm:Regular Rate:Normal     Neuro/Psych    GI/Hepatic negative GI ROS, (+) Hepatitis -  Endo/Other  negative endocrine ROS  Renal/GU negative Renal ROS     Musculoskeletal  (+) Arthritis -,   Abdominal   Peds  Hematology   Anesthesia Other Findings   Reproductive/Obstetrics                            Anesthesia Physical Anesthesia Plan  ASA: II  Anesthesia Plan: Spinal   Post-op Pain Management:    Induction: Intravenous  Airway Management Planned: Natural Airway  Additional Equipment:   Intra-op Plan:   Post-operative Plan:   Informed Consent: I have reviewed the patients History and Physical, chart, labs and discussed the procedure including the risks, benefits and alternatives for the proposed anesthesia with the patient or authorized representative who has indicated his/her understanding and acceptance.     Plan Discussed with: CRNA and Surgeon  Anesthesia Plan Comments:         Anesthesia Quick Evaluation

## 2014-09-28 NOTE — Progress Notes (Signed)
Received report from [place]

## 2014-09-28 NOTE — Transfer of Care (Signed)
Immediate Anesthesia Transfer of Care Note  Patient: Kirk Price  Procedure(s) Performed: Procedure(s): RIGHT TOTAL HIP ARTHROPLASTY ANTERIOR APPROACH (Right)  Patient Location: PACU  Anesthesia Type:Spinal  Level of Consciousness: awake, alert  and oriented  Airway & Oxygen Therapy: Patient Spontanous Breathing  Post-op Assessment: Report given to RN and Post -op Vital signs reviewed and stable  Post vital signs: Reviewed and stable  Last Vitals:  Filed Vitals:   09/28/14 1130  BP: 107/66  Pulse: 69  Temp: 36.4 C  Resp: 8    Complications: No apparent anesthesia complications

## 2014-09-28 NOTE — Evaluation (Signed)
Physical Therapy Evaluation Patient Details Name: Kirk Price MRN: [id number] DOB: [birth year] Today's Date: 09/28/2014   History of Present Illness  Patient is a 64 y/o male s/p R THA, direct anterior approach. PMH includes Hepatitis, cancer, arthritis.  Clinical Impression  Patient presents with pain, dizziness and post surgical deficits RLE s/p R THA impacting mobility. Education provided on HEP. Tolerated short distance ambulation to chair with Min guard assist. Pt will have 24/7 S at home. Will review exercises on handout tomorrow. Would benefit from skilled PT to maximize independence and mobility prior to return home.    Follow Up Recommendations Home health PT;Supervision/Assistance - 24 hour    Equipment Recommendations  None recommended by PT    Recommendations for Other Services       Precautions / Restrictions Precautions Precautions: Fall Precaution Comments: Direct anterior approach- no precautions. Restrictions Weight Bearing Restrictions: Yes RLE Weight Bearing: Weight bearing as tolerated      Mobility  Bed Mobility Overal bed mobility: Needs Assistance Bed Mobility: Supine to Sit     Supine to sit: Supervision;HOB elevated     General bed mobility comments: Use of rails for support. Able to bring RLE to EOB without assist.   Transfers Overall transfer level: Needs assistance Equipment used: Rolling walker (2 wheeled) Transfers: Sit to/from Stand Sit to Stand: Min guard         General transfer comment: Min guard to safety. + dizziness.    Ambulation/Gait Ambulation/Gait assistance: Min guard Ambulation Distance (Feet): 8 Feet Assistive device: Rolling walker (2 wheeled) Gait Pattern/deviations: Step-to pattern;Decreased stance time - right;Decreased step length - left;Trunk flexed     General Gait Details: Cues to decrease gait speed and for RW management. BP post ambulation 118/71, HR 61 bpm.  Stairs            Wheelchair Mobility    Modified Rankin (Stroke Patients Only)       Balance Overall balance assessment: Needs assistance Sitting-balance support: Feet supported;No upper extremity supported Sitting balance-Leahy Scale: Good     Standing balance support: During functional activity Standing balance-Leahy Scale: Fair                               Pertinent Vitals/Pain Pain Assessment: 0-10 Pain Score: 3  Pain Location: right hip Pain Descriptors / Indicators: Sore Pain Intervention(s): Monitored during session;Repositioned    Home Living Family/patient expects to be discharged to:: Private residence Living Arrangements: Spouse/significant other Available Help at Discharge: Family;Available 24 hours/day Type of Home: House Home Access: Stairs to enter   [employer] of Steps: 2 Home Layout: Able to live on main level with bedroom/bathroom;Two level Home Equipment: Walker - 2 wheels;Bedside commode      Prior Function Level of Independence: Independent               Hand Dominance        Extremity/Trunk Assessment   Upper Extremity Assessment: Defer to OT evaluation           Lower Extremity Assessment: RLE deficits/detail RLE Deficits / Details: Limited AROM hip flexion secondary to recent surgery and pain. Grossly ~3/5 throughout knee, ankle.       Communication   Communication: No difficulties  Cognition Arousal/Alertness: Awake/alert Behavior During Therapy: WFL for tasks assessed/performed Overall Cognitive Status: Within Functional Limits for tasks assessed  General Comments      Exercises Total Joint Exercises Ankle Circles/Pumps: Both;15 reps;Seated Quad Sets: Both;10 reps;Seated Gluteal Sets: Both;10 reps;Seated Long Arc Quad: Right;5 reps;Seated      Assessment/Plan    PT Assessment Patient needs continued PT services  PT Diagnosis Difficulty walking   PT Problem List Decreased strength;Impaired  sensation;Decreased balance;Decreased knowledge of use of DME;Decreased range of motion;Pain  PT Treatment Interventions Balance training;Gait training;Functional mobility training;Therapeutic exercise;Therapeutic activities;Patient/family education;Stair training;DME instruction   PT Goals (Current goals can be found in the Care Plan section) Acute Rehab PT Goals Patient Stated Goal: to get up and move around PT Goal Formulation: With patient Time For Goal Achievement: 10/12/14 Potential to Achieve Goals: Fair    Frequency 7X/week   Barriers to discharge Inaccessible home environment Pt has 2 steps to enter home without rails.    Co-evaluation               End of Session Equipment Utilized During Treatment: Gait belt Activity Tolerance: Patient tolerated treatment well Patient left: in chair;with call bell/phone within reach Nurse Communication: Mobility status         Time: 6213-0865 PT Time Calculation (min) (ACUTE ONLY): 29 min   Charges:   PT Evaluation $Initial PT Evaluation Tier I: 1 Procedure PT Treatments $Therapeutic Activity: 8-22 mins   PT G Codes:        Kaelie Henigan A Jasier Calabretta 09/28/2014, 3:45 PM Wray Kearns, [place], DPT [phone]

## 2014-09-29 LAB — CBC
HCT: 39.4 % (ref 39.0–52.0)
Hemoglobin: 13.2 g/dL (ref 13.0–17.0)
MCH: 30.5 pg (ref 26.0–34.0)
MCHC: 33.5 g/dL (ref 30.0–36.0)
MCV: 91 fL (ref 78.0–100.0)
Platelets: 239 10*3/uL (ref 150–400)
RBC: 4.33 MIL/uL (ref 4.22–5.81)
RDW: 12.6 % (ref 11.5–15.5)
WBC: 15.5 10*3/uL — AB (ref 4.0–10.5)

## 2014-09-29 LAB — BASIC METABOLIC PANEL
Anion gap: 10 (ref 5–15)
BUN: 15 mg/dL (ref 6–20)
CHLORIDE: 102 mmol/L (ref 101–111)
CO2: 24 mmol/L (ref 22–32)
CREATININE: 1.13 mg/dL (ref 0.61–1.24)
Calcium: 8.8 mg/dL — ABNORMAL LOW (ref 8.9–10.3)
Glucose, Bld: 149 mg/dL — ABNORMAL HIGH (ref 65–99)
POTASSIUM: 4.5 mmol/L (ref 3.5–5.1)
Sodium: 136 mmol/L (ref 135–145)

## 2014-09-29 NOTE — Progress Notes (Signed)
   Subjective: 1 Day Post-Op Procedure(s) (LRB): RIGHT TOTAL HIP ARTHROPLASTY ANTERIOR APPROACH (Right) Patient reports pain as mild.   Patient seen in rounds for Dr. Wynelle Link. Patient is well, and has had no acute complaints or problems. Reports that therapy has been going well. Did have some issues with urinary retention last night requiring cath. Voiding better this morning. No SOB or chest pain.   Objective: Vital signs in last 24 hours: Temp:  [97.2 F (36.2 C)-98.2 F (36.8 C)] 98.1 F (36.7 C) (05/28 0702) Pulse Rate:  [43-73] 52 (05/28 0702) Resp:  [8-18] 18 (05/27 1345) BP: (99-145)/(55-78) 117/61 mmHg (05/28 0702) SpO2:  [97 %-100 %] 100 % (05/28 0702)  Intake/Output from previous day:  Intake/Output Summary (Last 24 hours) at 09/29/14 0920 Last data filed at 09/29/14 0700  Gross per 24 hour  Intake   2060 ml  Output   2250 ml  Net   -190 ml     Labs:  Recent Labs  09/29/14 0438  HGB 13.2    Recent Labs  09/29/14 0438  WBC 15.5*  RBC 4.33  HCT 39.4  PLT 239    Recent Labs  09/29/14 0438  NA 136  K 4.5  CL 102  CO2 24  BUN 15  CREATININE 1.13  GLUCOSE 149*  CALCIUM 8.8*    EXAM General - Patient is Alert and Oriented Extremity - Neurologically intact Intact pulses distally Dorsiflexion/Plantar flexion intact Compartment soft Dressing - dressing C/D/I Motor Function - intact, moving foot and toes well on exam.  Hemovac pulled without difficulty.  Past Medical History  Diagnosis Date  . Arthritis   . Cancer     basal cells removed from face  . Hepatitis     Assessment/Plan: 1 Day Post-Op Procedure(s) (LRB): RIGHT TOTAL HIP ARTHROPLASTY ANTERIOR APPROACH (Right) Principal Problem:   OA (osteoarthritis) of hip  Estimated body mass index is 27.63 kg/(m^2) as calculated from the following:   Height as of 09/17/14: 5\' 11"  (1.803 m).   Weight as of this encounter: 89.812 kg (198 lb). Advance diet Up with therapy D/C IV  fluids Discharge home with home health  DVT Prophylaxis - Xarelto Weight Bearing As Tolerated   Continue PT today. If doing well and no voiding issues, DC home this afternoon.   Ardeen Jourdain, PA-C Orthopaedic Surgery 09/29/2014, 9:20 AM

## 2014-09-29 NOTE — Plan of Care (Signed)
Problem: Consults Goal: Diagnosis- Total Joint Replacement Primary Total Hip     

## 2014-09-29 NOTE — Progress Notes (Signed)
OT Cancellation Note and Discharge  Patient Details Name: Kirk Price MRN: [id number] DOB: 04/28/1951   Cancelled Treatment:    Reason Eval/Treat Not Completed: OT screened, no needs identified, will sign off. Pt moving at a Mod I level and does not voice any concerns about BADLs.  Almon Register [phone] 09/29/2014, 11:04 AM

## 2014-09-29 NOTE — Care Management Note (Signed)
Case Management Note  Patient Details  Name: Rithy Mandley MRN: [id number] Date of Birth: 09-Jul-1950  Subjective/Objective: 64 yo M underwent R THA.                 Action/Plan: PT is recommeding 24 hr supervision/assistance. No DME recommended.   Expected Discharge Date: 09/29/14                 Expected Discharge Plan:  [place]  In-House Referral:     Discharge planning Services  CM Consult  Post Acute Care Choice:  Home Health Choice offered to:  NA  DME Arranged:    DME Agency:     HH Arranged:  PT HH Agency:  [place]  Status of Service:  Completed, signed off  Medicare Important Message Given:    Date Medicare IM Given:    Medicare IM give by:    Date Additional Medicare IM Given:    Additional Medicare Important Message give by:     If discussed at [place] of Stay Meetings, dates discussed:    Additional Comments: met with pt to discuss D/C plan. He plans to return home with the support of his wife. He has a RW and an elevated toilet seat. HHPT was arranged with [facility] prior to surgery. He agreed with [facility].  Norina Buzzard, RN 09/29/2014, 11:23 AM

## 2014-09-29 NOTE — Progress Notes (Signed)
Physical Therapy Treatment Patient Details Name: Kirk Price MRN: [id number] DOB: 06/24/1950 Today's Date: 09/29/2014    History of Present Illness Patient is a 64 y/o male s/p R THA, direct anterior approach. PMH includes Hepatitis, cancer, arthritis.    PT Comments    Patient progressing well with mobility. Ambulating Mod I level with RW. Encouraged use of RW for at least 1 week for pain management/control. Performing stair negotiation Min guard assist for safety. Reviewed exercises and HEP. Pt has met all PT goals and is functioning at Mod I level except for stair negotiation which pt has assist for. All questions answered and education completed. Encouraged ambulation daily while in hospital. Discharge from therapy.    Follow Up Recommendations  Home health PT;Supervision/Assistance - 24 hour     Equipment Recommendations  None recommended by PT    Recommendations for Other Services       Precautions / Restrictions Precautions Precautions: None Precaution Comments: Direct anterior approach- no precautions. Restrictions Weight Bearing Restrictions: Yes RLE Weight Bearing: Weight bearing as tolerated    Mobility  Bed Mobility Overal bed mobility: Modified Independent Bed Mobility: Supine to Sit;Sit to Supine     Supine to sit: Modified independent (Device/Increase time) Sit to supine: Modified independent (Device/Increase time)   General bed mobility comments: HOB flat, no use of rails to simulate home although pt has motorized bed at home. Instructed pt to use LLE to assist RLE.  Transfers Overall transfer level: Needs assistance Equipment used: Rolling walker (2 wheeled);None Transfers: Sit to/from Stand Sit to Stand: Modified independent (Device/Increase time)         General transfer comment: Stood from EOB with and without RW. Instructed pt to have RW in front of pt prior to standing for safety initially.  Ambulation/Gait Ambulation/Gait assistance:  Modified independent (Device/Increase time) Ambulation Distance (Feet): 600 Feet Assistive device: Rolling walker (2 wheeled) Gait Pattern/deviations: Step-through pattern;Decreased stance time - right;Decreased step length - left   Gait velocity interpretation: at or above normal speed for age/gender General Gait Details: safe, steady gait with RW. Inquired about not using RW for ambulation however encouraged it for pain management for ~ 1 week.   Stairs Stairs: Yes Stairs assistance: Min guard Stair Management: Step to pattern;Alternating pattern;One rail Right Number of Stairs: 2 (+ 3 steps x2 bouts.) General stair comments: Cues for technique. Pt has door frame to use for support ascending steps. Comfortable with technique.  Wheelchair Mobility    Modified Rankin (Stroke Patients Only)       Balance Overall balance assessment: Needs assistance Sitting-balance support: Feet supported;No upper extremity supported Sitting balance-Leahy Scale: Good Sitting balance - Comments: Able to donn sock sitting EOB without difficulty.    Standing balance support: During functional activity Standing balance-Leahy Scale: Fair                      Cognition Arousal/Alertness: Awake/alert Behavior During Therapy: WFL for tasks assessed/performed Overall Cognitive Status: Within Functional Limits for tasks assessed                      Exercises Total Joint Exercises Quad Sets: Both;10 reps;Supine Short Arc Quad: Right;10 reps;Supine Heel Slides: Both;10 reps;Supine Straight Leg Raises: Right;10 reps;Supine Long Arc Quad: 10 reps;Both;Seated Marching in Standing: Both;10 reps;Standing Other Exercises Other Exercises: standing hamcurls 2x10 BLEs.     General Comments        Pertinent Vitals/Pain Pain Assessment: 0-10 Pain Score: 3  Pain Location: right hip Pain Descriptors / Indicators: Aching;Sore Pain Intervention(s): Monitored during session;Repositioned     Home Living                      Prior Function            PT Goals (current goals can now be found in the care plan section) Progress towards PT goals: Goals met/education completed, patient discharged from PT    Frequency  7X/week    PT Plan Current plan remains appropriate    Co-evaluation             End of Session Equipment Utilized During Treatment: Gait belt Activity Tolerance: Patient tolerated treatment well Patient left: in chair;with call bell/phone within reach;with family/visitor present     Time: 3086-5784 PT Time Calculation (min) (ACUTE ONLY): 29 min  Charges:  $Gait Training: 8-22 mins $Therapeutic Exercise: 8-22 mins                    G Codes:      Shauna A Hartshorne 09/29/2014, 10:44 AM Wray Kearns, PT, DPT [phone]

## 2014-10-02 ENCOUNTER — Encounter (HOSPITAL_COMMUNITY): Payer: Self-pay | Admitting: Orthopedic Surgery

## 2014-10-11 NOTE — Discharge Summary (Signed)
Physician Discharge Summary   Patient ID: Paulmichael Schreck MRN: [id number] DOB/AGE: 10/01/1950 64 y.o.  Admit date: 09/28/2014 Discharge date: 09/29/2014  Primary Diagnosis:  Osteoarthritis of the Right hip.   Admission Diagnoses:  Past Medical History  Diagnosis Date  . Arthritis   . Cancer     basal cells removed from face  . Hepatitis    Discharge Diagnoses:   Principal Problem:   OA (osteoarthritis) of hip  Estimated body mass index is 27.63 kg/(m^2) as calculated from the following:   Height as of 09/17/14: 5' 11"  (1.803 m).   Weight as of this encounter: 89.812 kg (198 lb).  Procedure(s) (LRB): RIGHT TOTAL HIP ARTHROPLASTY ANTERIOR APPROACH (Right)   Consults: None  HPI: Kirk Price is a 64 y.o. male who has advanced end-  stage arthritis of his Right hip with progressively worsening pain and  dysfunction.The patient has failed nonoperative management and presents for  total hip arthroplasty.  Laboratory Data: Admission on 09/28/2014, Discharged on 09/29/2014  Component Date Value Ref Range Status  . ABO/RH(D) 09/28/2014 O POS   Final  . Antibody Screen 09/28/2014 NEG   Final  . Sample Expiration 09/28/2014 10/01/2014   Final  . aPTT 09/28/2014 78* 24 - 37 seconds Final   Comment:        IF BASELINE aPTT IS ELEVATED, SUGGEST PATIENT RISK ASSESSMENT BE USED TO DETERMINE APPROPRIATE ANTICOAGULANT THERAPY.   . ABO/RH(D) 09/28/2014 O POS   Final  . WBC 09/29/2014 15.5* 4.0 - 10.5 K/uL Final  . RBC 09/29/2014 4.33  4.22 - 5.81 MIL/uL Final  . Hemoglobin 09/29/2014 13.2  13.0 - 17.0 g/dL Final  . HCT 09/29/2014 39.4  39.0 - 52.0 % Final  . MCV 09/29/2014 91.0  78.0 - 100.0 fL Final  . MCH 09/29/2014 30.5  26.0 - 34.0 pg Final  . MCHC 09/29/2014 33.5  30.0 - 36.0 g/dL Final  . RDW 09/29/2014 12.6  11.5 - 15.5 % Final  . Platelets 09/29/2014 239  150 - 400 K/uL Final  . Sodium 09/29/2014 136  135 - 145 mmol/L Final  . Potassium 09/29/2014 4.5  3.5 - 5.1 mmol/L  Final  . Chloride 09/29/2014 102  101 - 111 mmol/L Final  . CO2 09/29/2014 24  22 - 32 mmol/L Final  . Glucose, Bld 09/29/2014 149* 65 - 99 mg/dL Final  . BUN 09/29/2014 15  6 - 20 mg/dL Final  . Creatinine, Ser 09/29/2014 1.13  0.61 - 1.24 mg/dL Final  . Calcium 09/29/2014 8.8* 8.9 - 10.3 mg/dL Final  . GFR calc non Af Amer 09/29/2014 >60  >60 mL/min Final  . GFR calc Af Amer 09/29/2014 >60  >60 mL/min Final   Comment: (NOTE) The eGFR has been calculated using the CKD EPI equation. This calculation has not been validated in all clinical situations. eGFR's persistently <60 mL/min signify possible Chronic Kidney Disease.   Georgiann Hahn gap 09/29/2014 10  5 - 15 Final  Hospital Outpatient Visit on 09/17/2014  Component Date Value Ref Range Status  . MRSA, PCR 09/17/2014 NEGATIVE  NEGATIVE Final  . Staphylococcus aureus 09/17/2014 NEGATIVE  NEGATIVE Final   Comment:        The Xpert SA Assay (FDA approved for NASAL specimens in patients over 28 years of age), is one component of a comprehensive surveillance program.  Test performance has been validated by [facility] for patients greater than or equal to 46 year old. It is not intended to diagnose infection  nor to guide or monitor treatment.   Marland Kitchen aPTT 09/17/2014 56* 24 - 37 seconds Final   Comment:        IF BASELINE aPTT IS ELEVATED, SUGGEST PATIENT RISK ASSESSMENT BE USED TO DETERMINE APPROPRIATE ANTICOAGULANT THERAPY.   . WBC 09/17/2014 6.6  4.0 - 10.5 K/uL Final  . RBC 09/17/2014 5.10  4.22 - 5.81 MIL/uL Final  . Hemoglobin 09/17/2014 15.6  13.0 - 17.0 g/dL Final  . HCT 09/17/2014 47.6  39.0 - 52.0 % Final  . MCV 09/17/2014 93.3  78.0 - 100.0 fL Final  . MCH 09/17/2014 30.6  26.0 - 34.0 pg Final  . MCHC 09/17/2014 32.8  30.0 - 36.0 g/dL Final  . RDW 09/17/2014 12.9  11.5 - 15.5 % Final  . Platelets 09/17/2014 253  150 - 400 K/uL Final  . Sodium 09/17/2014 145  135 - 145 mmol/L Final  . Potassium 09/17/2014 5.0  3.5 - 5.1  mmol/L Final  . Chloride 09/17/2014 108  101 - 111 mmol/L Final  . CO2 09/17/2014 27  22 - 32 mmol/L Final  . Glucose, Bld 09/17/2014 93  65 - 99 mg/dL Final  . BUN 09/17/2014 18  6 - 20 mg/dL Final  . Creatinine, Ser 09/17/2014 1.07  0.61 - 1.24 mg/dL Final  . Calcium 09/17/2014 9.5  8.9 - 10.3 mg/dL Final  . Total Protein 09/17/2014 6.2* 6.5 - 8.1 g/dL Final  . Albumin 09/17/2014 3.9  3.5 - 5.0 g/dL Final  . AST 09/17/2014 21  15 - 41 U/L Final  . ALT 09/17/2014 19  17 - 63 U/L Final  . Alkaline Phosphatase 09/17/2014 92  38 - 126 U/L Final  . Total Bilirubin 09/17/2014 0.5  0.3 - 1.2 mg/dL Final  . GFR calc non Af Amer 09/17/2014 >60  >60 mL/min Final  . GFR calc Af Amer 09/17/2014 >60  >60 mL/min Final   Comment: (NOTE) The eGFR has been calculated using the CKD EPI equation. This calculation has not been validated in all clinical situations. eGFR's persistently <60 mL/min signify possible Chronic Kidney Disease.   . Anion gap 09/17/2014 10  5 - 15 Final  . Prothrombin Time 09/17/2014 13.5  11.6 - 15.2 seconds Final  . INR 09/17/2014 1.02  0.00 - 1.49 Final  . Color, Urine 09/17/2014 YELLOW  YELLOW Final  . APPearance 09/17/2014 CLEAR  CLEAR Final  . Specific Gravity, Urine 09/17/2014 1.026  1.005 - 1.030 Final  . pH 09/17/2014 5.0  5.0 - 8.0 Final  . Glucose, UA 09/17/2014 NEGATIVE  NEGATIVE mg/dL Final  . Hgb urine dipstick 09/17/2014 NEGATIVE  NEGATIVE Final  . Bilirubin Urine 09/17/2014 NEGATIVE  NEGATIVE Final  . Ketones, ur 09/17/2014 NEGATIVE  NEGATIVE mg/dL Final  . Protein, ur 09/17/2014 NEGATIVE  NEGATIVE mg/dL Final  . Urobilinogen, UA 09/17/2014 0.2  0.0 - 1.0 mg/dL Final  . Nitrite 09/17/2014 NEGATIVE  NEGATIVE Final  . Leukocytes, UA 09/17/2014 NEGATIVE  NEGATIVE Final   MICROSCOPIC NOT DONE ON URINES WITH NEGATIVE PROTEIN, BLOOD, LEUKOCYTES, NITRITE, OR GLUCOSE <1000 mg/dL.     X-Rays:Dg Pelvis Portable  09/28/2014   CLINICAL DATA:  Post right hip  replacement.  EXAM: PORTABLE PELVIS 1-2 VIEWS  COMPARISON:  None.  FINDINGS: Single AP view of the pelvis demonstrates changes of right hip replacement. Normal AP alignment. No hardware or bony complicating feature. Soft tissue drain in place.  IMPRESSION: Right hip replacement.  No visible complicating feature.   Electronically Signed  By: Rolm Baptise M.D.   On: 09/28/2014 11:58   Dg C-arm 1-60 Min-no Report  09/28/2014   CLINICAL DATA: operative hip   C-ARM 1-60 MINUTES  Fluoroscopy was utilized by the requesting physician.  No radiographic  interpretation.     EKG: Orders placed or performed during the hospital encounter of 09/17/14  . EKG 12-Lead  . EKG 12-Lead     Hospital Course: Patient was admitted to [facility] and taken to the OR and underwent the above state procedure without complications.  Patient tolerated the procedure well and was later transferred to the recovery room and then to the orthopaedic floor for postoperative care.  They were given PO and IV analgesics for pain control following their surgery.  They were given 24 hours of postoperative antibiotics of  Anti-infectives    Start     Dose/Rate Route Frequency Ordered Stop   09/28/14 1530  ceFAZolin (ANCEF) IVPB 2 g/50 mL premix     2 g 100 mL/hr over 30 Minutes Intravenous Every 6 hours 09/28/14 1336 09/29/14 0351   09/28/14 0800  ceFAZolin (ANCEF) IVPB 2 g/50 mL premix  Status:  Discontinued     2 g 100 mL/hr over 30 Minutes Intravenous To ShortStay Surgical 09/27/14 1215 09/27/14 1217   09/28/14 0800  ceFAZolin (ANCEF) IVPB 2 g/50 mL premix    Comments:  VERIFY ALLERGIES prior to administering   2 g 100 mL/hr over 30 Minutes Intravenous To ShortStay Surgical 09/27/14 1217 09/28/14 0951     and started on DVT prophylaxis in the form of Xarelto.   PT and OT were ordered for total hip protocol.  The patient was allowed to be WBAT with therapy. Discharge planning was consulted to help with postop disposition  and equipment needs.  Patient had a tough night on the evening of surgery with some urinary retention but was voiding better the next morning.  They started to get up OOB with therapy on day one.   Patient was seen in rounds and worked with therapy.  Progressed well and was ready to go home.  Diet: Cardiac diet Activity:WBAT Follow-up:in 2weeks Disposition - Home Discharged Condition: good      Medication List    STOP taking these medications        ibuprofen 200 MG tablet  Commonly known as:  ADVIL,MOTRIN     OVER THE COUNTER MEDICATION      TAKE these medications        methocarbamol 500 MG tablet  Commonly known as:  ROBAXIN  Take 1 tablet (500 mg total) by mouth every 6 (six) hours as needed for muscle spasms.     oxyCODONE 5 MG immediate release tablet  Commonly known as:  Oxy IR/ROXICODONE  Take 1-2 tablets (5-10 mg total) by mouth every 3 (three) hours as needed for breakthrough pain.     rivaroxaban 10 MG Tabs tablet  Commonly known as:  XARELTO  Take 1 tablet (10 mg total) by mouth daily with breakfast.     traMADol 50 MG tablet  Commonly known as:  ULTRAM  Take 1-2 tablets (50-100 mg total) by mouth every 6 (six) hours as needed for moderate pain.           Follow-up Information    Follow up with Gearlean Alf, MD. Schedule an appointment as soon as possible for a visit on 10/11/2014.   Specialty:  Orthopedic Surgery   Why:  Call [phone] Tuesday to make the appointment  Contact information:   [address] [place] [address] [phone]       Follow up with [facility].   Why:  Home Health Physical Therapy arranged by doctor's office.   Contact information:   [place] [address] [phone]       Signed: Arlee Muslim, PA-C Orthopaedic Surgery 10/11/2014, 8:58 AM

## 2015-10-30 DIAGNOSIS — G8929 Other chronic pain: Secondary | ICD-10-CM | POA: Diagnosis not present

## 2015-10-30 DIAGNOSIS — M25561 Pain in right knee: Secondary | ICD-10-CM | POA: Diagnosis not present

## 2015-11-11 DIAGNOSIS — M25552 Pain in left hip: Secondary | ICD-10-CM | POA: Diagnosis not present

## 2016-02-13 DIAGNOSIS — Z23 Encounter for immunization: Secondary | ICD-10-CM | POA: Diagnosis not present

## 2016-02-13 DIAGNOSIS — D1801 Hemangioma of skin and subcutaneous tissue: Secondary | ICD-10-CM | POA: Diagnosis not present

## 2016-02-13 DIAGNOSIS — D2371 Other benign neoplasm of skin of right lower limb, including hip: Secondary | ICD-10-CM | POA: Diagnosis not present

## 2016-02-13 DIAGNOSIS — Z85828 Personal history of other malignant neoplasm of skin: Secondary | ICD-10-CM | POA: Diagnosis not present

## 2016-02-13 DIAGNOSIS — L821 Other seborrheic keratosis: Secondary | ICD-10-CM | POA: Diagnosis not present

## 2016-02-13 DIAGNOSIS — D225 Melanocytic nevi of trunk: Secondary | ICD-10-CM | POA: Diagnosis not present

## 2016-02-13 DIAGNOSIS — L820 Inflamed seborrheic keratosis: Secondary | ICD-10-CM | POA: Diagnosis not present

## 2016-02-13 DIAGNOSIS — L570 Actinic keratosis: Secondary | ICD-10-CM | POA: Diagnosis not present

## 2016-02-13 DIAGNOSIS — D2272 Melanocytic nevi of left lower limb, including hip: Secondary | ICD-10-CM | POA: Diagnosis not present

## 2016-11-13 DIAGNOSIS — D485 Neoplasm of uncertain behavior of skin: Secondary | ICD-10-CM | POA: Diagnosis not present

## 2016-11-13 DIAGNOSIS — Z85828 Personal history of other malignant neoplasm of skin: Secondary | ICD-10-CM | POA: Diagnosis not present

## 2016-11-13 DIAGNOSIS — B079 Viral wart, unspecified: Secondary | ICD-10-CM | POA: Diagnosis not present

## 2016-11-13 DIAGNOSIS — L820 Inflamed seborrheic keratosis: Secondary | ICD-10-CM | POA: Diagnosis not present

## 2017-01-22 DIAGNOSIS — E860 Dehydration: Secondary | ICD-10-CM | POA: Diagnosis not present

## 2017-01-22 DIAGNOSIS — R1032 Left lower quadrant pain: Secondary | ICD-10-CM | POA: Diagnosis not present

## 2017-02-16 DIAGNOSIS — Z85828 Personal history of other malignant neoplasm of skin: Secondary | ICD-10-CM | POA: Diagnosis not present

## 2017-02-16 DIAGNOSIS — D225 Melanocytic nevi of trunk: Secondary | ICD-10-CM | POA: Diagnosis not present

## 2017-02-16 DIAGNOSIS — D485 Neoplasm of uncertain behavior of skin: Secondary | ICD-10-CM | POA: Diagnosis not present

## 2017-02-16 DIAGNOSIS — L821 Other seborrheic keratosis: Secondary | ICD-10-CM | POA: Diagnosis not present

## 2017-02-16 DIAGNOSIS — L570 Actinic keratosis: Secondary | ICD-10-CM | POA: Diagnosis not present

## 2017-02-16 DIAGNOSIS — D2371 Other benign neoplasm of skin of right lower limb, including hip: Secondary | ICD-10-CM | POA: Diagnosis not present

## 2017-02-16 DIAGNOSIS — D1722 Benign lipomatous neoplasm of skin and subcutaneous tissue of left arm: Secondary | ICD-10-CM | POA: Diagnosis not present

## 2017-02-16 DIAGNOSIS — D2272 Melanocytic nevi of left lower limb, including hip: Secondary | ICD-10-CM | POA: Diagnosis not present

## 2017-02-16 DIAGNOSIS — L812 Freckles: Secondary | ICD-10-CM | POA: Diagnosis not present

## 2017-02-16 DIAGNOSIS — D1801 Hemangioma of skin and subcutaneous tissue: Secondary | ICD-10-CM | POA: Diagnosis not present

## 2017-03-18 DIAGNOSIS — Z85828 Personal history of other malignant neoplasm of skin: Secondary | ICD-10-CM | POA: Diagnosis not present

## 2017-03-18 DIAGNOSIS — D485 Neoplasm of uncertain behavior of skin: Secondary | ICD-10-CM | POA: Diagnosis not present

## 2017-03-18 DIAGNOSIS — L988 Other specified disorders of the skin and subcutaneous tissue: Secondary | ICD-10-CM | POA: Diagnosis not present

## 2017-04-16 DIAGNOSIS — M1612 Unilateral primary osteoarthritis, left hip: Secondary | ICD-10-CM | POA: Diagnosis not present

## 2017-04-16 DIAGNOSIS — M19011 Primary osteoarthritis, right shoulder: Secondary | ICD-10-CM | POA: Diagnosis not present

## 2017-05-26 DIAGNOSIS — H00024 Hordeolum internum left upper eyelid: Secondary | ICD-10-CM | POA: Diagnosis not present

## 2017-06-14 DIAGNOSIS — L821 Other seborrheic keratosis: Secondary | ICD-10-CM | POA: Diagnosis not present

## 2017-06-14 DIAGNOSIS — L570 Actinic keratosis: Secondary | ICD-10-CM | POA: Diagnosis not present

## 2017-06-14 DIAGNOSIS — Z85828 Personal history of other malignant neoplasm of skin: Secondary | ICD-10-CM | POA: Diagnosis not present

## 2017-08-06 DIAGNOSIS — A084 Viral intestinal infection, unspecified: Secondary | ICD-10-CM | POA: Diagnosis not present

## 2017-09-01 DIAGNOSIS — R972 Elevated prostate specific antigen [PSA]: Secondary | ICD-10-CM | POA: Diagnosis not present

## 2017-09-01 DIAGNOSIS — N5201 Erectile dysfunction due to arterial insufficiency: Secondary | ICD-10-CM | POA: Diagnosis not present

## 2018-02-18 DIAGNOSIS — R05 Cough: Secondary | ICD-10-CM | POA: Diagnosis not present

## 2018-02-18 DIAGNOSIS — Z23 Encounter for immunization: Secondary | ICD-10-CM | POA: Diagnosis not present

## 2018-03-01 DIAGNOSIS — Z0000 Encounter for general adult medical examination without abnormal findings: Secondary | ICD-10-CM | POA: Diagnosis not present

## 2018-03-01 DIAGNOSIS — Z85828 Personal history of other malignant neoplasm of skin: Secondary | ICD-10-CM | POA: Diagnosis not present

## 2018-03-01 DIAGNOSIS — Z1389 Encounter for screening for other disorder: Secondary | ICD-10-CM | POA: Diagnosis not present

## 2018-03-01 DIAGNOSIS — E7800 Pure hypercholesterolemia, unspecified: Secondary | ICD-10-CM | POA: Diagnosis not present

## 2018-03-01 DIAGNOSIS — Z8589 Personal history of malignant neoplasm of other organs and systems: Secondary | ICD-10-CM | POA: Diagnosis not present

## 2018-03-01 DIAGNOSIS — J302 Other seasonal allergic rhinitis: Secondary | ICD-10-CM | POA: Diagnosis not present

## 2018-03-01 DIAGNOSIS — Z23 Encounter for immunization: Secondary | ICD-10-CM | POA: Diagnosis not present

## 2018-03-01 DIAGNOSIS — Z86010 Personal history of colonic polyps: Secondary | ICD-10-CM | POA: Diagnosis not present

## 2018-03-01 DIAGNOSIS — R972 Elevated prostate specific antigen [PSA]: Secondary | ICD-10-CM | POA: Diagnosis not present

## 2018-05-12 DIAGNOSIS — M1612 Unilateral primary osteoarthritis, left hip: Secondary | ICD-10-CM | POA: Diagnosis not present

## 2018-05-12 DIAGNOSIS — Z471 Aftercare following joint replacement surgery: Secondary | ICD-10-CM | POA: Diagnosis not present

## 2018-05-12 DIAGNOSIS — Z96641 Presence of right artificial hip joint: Secondary | ICD-10-CM | POA: Diagnosis not present

## 2018-05-27 ENCOUNTER — Ambulatory Visit
Admission: AD | Admit: 2018-05-27 | Discharge: 2018-05-27 | Disposition: A | Discharge status: Home / Self Care | Payer: Medicare Other | Source: Ambulatory Visit | Attending: Emergency Medicine | Admitting: Emergency Medicine

## 2018-05-27 DIAGNOSIS — S39012A Strain of muscle, fascia and tendon of lower back, initial encounter: Secondary | ICD-10-CM | POA: Insufficient documentation

## 2018-05-27 HISTORY — DX: Basal cell carcinoma of skin of unspecified parts of face: C44.310

## 2018-05-27 HISTORY — DX: Unspecified osteoarthritis, unspecified site: M19.90

## 2018-05-27 LAB — HM HIV SCREENING OFFERED

## 2018-05-27 MED ORDER — MELOXICAM 7.5 MG PO TABS *I*
7.5000 mg | ORAL_TABLET | Freq: Every day | ORAL | 0 refills | Status: AC
Start: 2018-05-27 — End: 2018-06-03

## 2018-05-27 MED ORDER — KETOROLAC TROMETHAMINE 30 MG/ML IJ SOLN *I*
15.0000 mg | Freq: Once | INTRAMUSCULAR | Status: AC
Start: 2018-05-27 — End: 2018-05-27
  Administered 2018-05-27: 10:00:00 15 mg via INTRAMUSCULAR

## 2018-05-27 MED ORDER — METAXALONE 800 MG PO TABS *I*
400.0000 mg | ORAL_TABLET | Freq: Every evening | ORAL | 0 refills | Status: AC | PRN
Start: 2018-05-27 — End: ?

## 2018-05-27 NOTE — ED Triage Notes (Signed)
Pt presents with lower back pain that started wed morning. Pt denies numbess/tingling of RLE, radiating of pain, bowel/bladder incontinence, or groin numbness. Pt denies specific injury to back however pt did play tennis on monday and reports that he doesn't stand straight d/t hip replacement of right hip. Pt reports that pain is worse when he stands. Pt reports taking ibuprofen 400 mg at 0400.       Triage Note   Janora Norlander, RN

## 2018-05-27 NOTE — UC Provider Note (Signed)
History     Chief Complaint   Patient presents with    Back Pain     Pt presents with lower back pain that started wed morning. Pt denies numbess/tingling of RLE, radiating of pain, bowel/bladder incontinence, or groin numbness. Pt denies specific injury to back however pt did play tennis on monday and reports that he doesn't stand straight d/t hip replacement of right hip. Pt reports that pain is worse when he stands. Pt reports taking ibuprofen 400 mg at 4119.     68 year old male with past medical history significant for osteoarthritis presents complaining of right-sided lower back pain that started Wednesday morning.  Denies any known trauma, falls, twisting, or heavy lifting.  Patient describes the pain as dull and constant and worse with standing upright.  Patient reports at baseline has difficulty standing straight up due to right sided hip replacement.  Is scheduled for left hip replacement in the near future per patient.  Has been taking ibuprofen and utilizing salon pas patches with only mild improvement in symptoms.  Does mention playing tennis on Monday however denies have pain until Wednesday.          Medical/Surgical/Family History     Past Medical History:   Diagnosis Date    Basal cell carcinoma (BCC) of skin of face     Osteoarthritis         There is no problem list on file for this patient.           Past Surgical History:   Procedure Laterality Date    HIP REPLACEMENT      right    PILONIDAL CYST DRAINAGE       No family history on file.       Social History     Tobacco Use    Smoking status: Never Smoker    Smokeless tobacco: Never Used   Substance Use Topics    Alcohol use: Yes     Alcohol/week: 1.0 - 2.0 standard drinks     Types: 1 - 2 Glasses of wine per week    Drug use: Never     Living Situation     Questions Responses    Patient lives with     Homeless No    Caregiver for other family member No    External Services None    Employment Retired    Curator Violence Risk No                 Review of Systems   Review of Systems   Constitutional: Positive for activity change. Negative for diaphoresis.   Respiratory: Negative for shortness of breath.    Cardiovascular: Negative for chest pain.   Musculoskeletal: Positive for back pain and gait problem. Negative for joint swelling and neck pain.   Skin: Negative for color change and wound.   Neurological: Negative for dizziness, tremors, weakness, light-headedness and numbness.       Physical Exam   Triage Vitals  Triage Start: Start, (05/27/18 5361)   First Recorded BP: 131/88, Resp: 16, Temp: 36 C (96.8 F), Temp src: TEMPORAL Oxygen Therapy SpO2: 97 %, O2 Device: None (Room air), Heart Rate: 74, (05/27/18 0954)  .  First Pain Reported  0-10 Scale: 6, Pain Location/Orientation: Back, (05/27/18 4431)       Physical Exam  Vitals signs and nursing note reviewed.   Constitutional:       General: He is not in acute distress.  Appearance: He is well-developed. He is not diaphoretic.   HENT:      Head: Normocephalic and atraumatic.      Nose: Nose normal.   Eyes:      Pupils: Pupils are equal, round, and reactive to light.   Neck:      Musculoskeletal: Normal range of motion and neck supple.   Cardiovascular:      Rate and Rhythm: Normal rate and regular rhythm.      Pulses: Normal pulses.      Heart sounds: No murmur.   Pulmonary:      Effort: Pulmonary effort is normal.      Breath sounds: Normal breath sounds.   Abdominal:      Tenderness: There is no right CVA tenderness or left CVA tenderness.   Musculoskeletal:         General: Tenderness present. No deformity.      Right hip: Normal.      Left hip: Normal.      Cervical back: Normal.      Thoracic back: Normal.      Lumbar back: He exhibits decreased range of motion (difficulty with right flexion and extension of back) and tenderness. He exhibits no bony tenderness and no swelling.   Skin:     General: Skin is warm and dry.      Findings: No erythema.   Neurological:      Mental Status:  He is alert and oriented to person, place, and time.      Motor: No abnormal muscle tone.   Psychiatric:         Behavior: Behavior normal.         Thought Content: Thought content normal.         Judgment: Judgment normal.          Medical Decision Making        Initial Evaluation:  ED First Provider Contact     Date/Time Event User Comments    05/27/18 1001 ED First Provider Contact Anuhea Gassner Initial Face to Face Provider Contact          Patient was seen on: 05/27/2018        Assessment:  68 y.o.male comes to the Urgent [place] with right sided back discomfort since Wednesday. Worse with standing upright. No radiculopathy or BLE weakness.     Differential Diagnosis includes:  Shingles  Strain  Muscle spasms  Herniated disc  Myofascial pain    Plan:   Orders Placed This Encounter    HM HIV SCREENING OFFERED    ketorolac (TORADOL) 30 mg/mL injection 15 mg    meloxicam (MOBIC) 7.5 MG tablet    metaxalone (SKELAXIN) 800 MG tablet       Recent Results (from the past 24 hour(s))   HM HIV SCREENING OFFERED    Collection Time: 05/27/18 12:00 AM   Result Value Ref Range    HM HIV SCREENING OFFERED Declined      Patient reports moderate improvement in pain after toardol injection. Verbalize comfort with discharge home. Aware if pain doesn't improve over the next week or two that he should follow up with PCP and possibly with PT or Ortho depending on severity of symptoms.     Use over the counter medications as discussed.    Please start the new medications as below:    Current Discharge Medication List      New Medications    Details Last Dose Given Next Dose Due  Script Given?   meloxicam (MOBIC) 7.5 mg Dose: 7.5 mg  Take 7.5 mg by mouth daily  Quantity 7 tablet, Refill 0  Start date: 05/27/2018, End date: 06/03/2018       Comments: Emergency Encounter            metaxalone (SKELAXIN) 400 mg Dose: 400 mg  Take 400 mg by mouth nightly as needed for Pain  Quantity 10 tablet, Refill 0  Start date: 05/27/2018                    I have reviewed all labs and discharge instructions with the patient. I have answered all questions to the best of my knowledge. Patient/caregiver verbalizes understanding and is agreeable to discharge.    Patient advised if symptoms persist or worsen to seek urgent evaluation in ER.     [employer] forces training and education officer was used for part/all of this encounter. Errors in grammar were changed and fixed to the best of my ability.           Final Diagnosis  Final diagnoses:   [O13.086V] Strain of lumbar region, initial encounter (Primary)         Robyne Askew, NP              Robyne Askew, NP  05/27/18 1222

## 2018-05-27 NOTE — Discharge Instructions (Addendum)
Rest, avoid bending/lifting/twisting for the next 48 hours    Meloxicam 7.5 mg once a day (take with food- do not take any ibuprofen, aleve, naproxen, advil, or motrin in addition to this medication)     This medication is not a "rescue" medication and will not alleviate you back pain completely. This is used to help speed up the natural recovery process of back injuries related to the muscles and ligaments by reducing inflammation.     Skelaxin can make you drowsy so please do not drive or operate heavy machinery while on this medication.     If you develop worsening pain, any leg weakness or numbness, inability to weight bear, nausea, vomiting, abdominal pain please go to the ER for urgent evaluation.

## 2018-05-27 NOTE — ED Notes (Signed)
Pt discharged home. Pt verbalized understanding of all discharge instructions including follow-up. All questions answered. Pt belonging accounted for and taken by patient at discharge.

## 2018-09-29 DIAGNOSIS — Z7189 Other specified counseling: Secondary | ICD-10-CM | POA: Diagnosis not present

## 2018-09-29 DIAGNOSIS — M25552 Pain in left hip: Secondary | ICD-10-CM | POA: Diagnosis not present

## 2018-11-03 DIAGNOSIS — D1730 Benign lipomatous neoplasm of skin and subcutaneous tissue of unspecified sites: Secondary | ICD-10-CM | POA: Diagnosis not present

## 2018-11-03 DIAGNOSIS — L570 Actinic keratosis: Secondary | ICD-10-CM | POA: Diagnosis not present

## 2018-11-03 DIAGNOSIS — Z85828 Personal history of other malignant neoplasm of skin: Secondary | ICD-10-CM | POA: Diagnosis not present

## 2018-11-03 DIAGNOSIS — D485 Neoplasm of uncertain behavior of skin: Secondary | ICD-10-CM | POA: Diagnosis not present

## 2018-11-03 DIAGNOSIS — L821 Other seborrheic keratosis: Secondary | ICD-10-CM | POA: Diagnosis not present

## 2018-11-03 DIAGNOSIS — D3614 Benign neoplasm of peripheral nerves and autonomic nervous system of thorax: Secondary | ICD-10-CM | POA: Diagnosis not present

## 2018-11-03 DIAGNOSIS — L814 Other melanin hyperpigmentation: Secondary | ICD-10-CM | POA: Diagnosis not present

## 2018-11-28 DIAGNOSIS — E7800 Pure hypercholesterolemia, unspecified: Secondary | ICD-10-CM | POA: Diagnosis not present

## 2018-11-28 DIAGNOSIS — R972 Elevated prostate specific antigen [PSA]: Secondary | ICD-10-CM | POA: Diagnosis not present

## 2018-11-28 DIAGNOSIS — Z131 Encounter for screening for diabetes mellitus: Secondary | ICD-10-CM | POA: Diagnosis not present

## 2018-11-28 DIAGNOSIS — M1612 Unilateral primary osteoarthritis, left hip: Secondary | ICD-10-CM | POA: Diagnosis not present

## 2018-11-28 DIAGNOSIS — Z01818 Encounter for other preprocedural examination: Secondary | ICD-10-CM | POA: Diagnosis not present

## 2018-11-28 NOTE — H&P (Signed)
TOTAL HIP ADMISSION H&P  Patient is admitted for left total hip arthroplasty.  Subjective:  Chief Complaint: left hip pain  HPI: Kirk Price, 68 y.o. male, has a history of pain and functional disability in the left hip(s) due to arthritis and patient has failed non-surgical conservative treatments for greater than 12 weeks to include activity modification.  Onset of symptoms was gradual starting 3 years ago with gradually worsening course since that time.The patient noted no past surgery on the left hip(s).  Patient currently rates pain in the left hip at 2 out of 10 with activity. Patient has worsening of pain with activity and weight bearing, pain that interfers with activities of daily living and stiffness. Patient has evidence of joint space narrowing and degeneration centrally, inferiorly, and posteriorly with a large osteophyte at the inferior acetabular brim by imaging studies. This condition presents safety issues increasing the risk of falls. There is no current active infection.  Patient Active Problem List   Diagnosis Date Noted  . OA (osteoarthritis) of hip 09/28/2014   Past Medical History:  Diagnosis Date  . Arthritis   . Cancer    basal cells removed from face  . Hepatitis     Past Surgical History:  Procedure Laterality Date  . basal cell skin cancer      from face  . PILONIDAL CYST EXCISION     1975  and  1980"s  . TONSILLECTOMY    . TOTAL HIP ARTHROPLASTY Right 09/28/2014   dr Maureen Ralphs  . TOTAL HIP ARTHROPLASTY Right 09/28/2014   Procedure: RIGHT TOTAL HIP ARTHROPLASTY ANTERIOR APPROACH;  Surgeon: Gaynelle Arabian, MD;  Location: [facility];  Service: Orthopedics;  Laterality: Right;    No current facility-administered medications for this encounter.    Current Outpatient Medications  Medication Sig Dispense Refill Last Dose  . methocarbamol (ROBAXIN) 500 MG tablet Take 1 tablet (500 mg total) by mouth every 6 (six) hours as needed for muscle spasms. 80 tablet 1    . oxyCODONE (OXY IR/ROXICODONE) 5 MG immediate release tablet Take 1-2 tablets (5-10 mg total) by mouth every 3 (three) hours as needed for breakthrough pain. 80 tablet 0   . rivaroxaban (XARELTO) 10 MG TABS tablet Take 1 tablet (10 mg total) by mouth daily with breakfast. 19 tablet 0   . traMADol (ULTRAM) 50 MG tablet Take 1-2 tablets (50-100 mg total) by mouth every 6 (six) hours as needed for moderate pain. 90 tablet 1    No Known Allergies  Social History   Tobacco Use  . Smoking status: Never Smoker  . Smokeless tobacco: Never Used  Substance Use Topics  . Alcohol use: Yes    Alcohol/week: 1.0 standard drinks    Types: 1 Glasses of wine per week    No family history on file.   Review of Systems  Constitutional: Negative for chills and fever.  HENT: Negative for congestion, sore throat and tinnitus.   Eyes: Negative for double vision, photophobia and pain.  Respiratory: Negative for cough, shortness of breath and wheezing.   Cardiovascular: Negative for chest pain, palpitations and orthopnea.  Gastrointestinal: Negative for heartburn, nausea and vomiting.  Genitourinary: Negative for dysuria, frequency and urgency.  Musculoskeletal: Positive for joint pain.  Neurological: Negative for dizziness, weakness and headaches.    Objective:  Physical Exam  Well nourished and well developed.  General: Alert and oriented x3, cooperative and pleasant, no acute distress.  Head: normocephalic, atraumatic, neck supple.  Eyes: EOMI.  Respiratory:  breath sounds clear in all fields, no wheezing, rales, or rhonchi. Cardiovascular: Regular rate and rhythm, no murmurs, gallops or rubs.  Abdomen: non-tender to palpation and soft, normoactive bowel sounds. Musculoskeletal:  Left Hip: No tenderness to palpation about the left greater trochanteric bursa. Pain with passive motion of the left hip. ROM 120 degrees flexion, 15 degrees internal rotation, 15 degrees external rotation, and 20 degrees  abduction.  Calves soft and nontender. Motor function intact in LE. Strength 5/5 LE bilaterally. Neuro: Distal pulses 2+. Sensation to light touch intact in LE.  Vital signs in last 24 hours: Blood pressure: 128/74 mmHg Pulse: 72 bpm  Labs:   Estimated body mass index is 27.62 kg/m as calculated from the following:   Height as of 09/17/14: 5\' 11"  (1.803 m).   Weight as of 09/28/14: 89.8 kg.   Imaging Review Plain radiographs demonstrate severe degenerative joint disease of the left hip(s). The bone quality appears to be adequate for age and reported activity level.      Assessment/Plan:  End stage arthritis, left hip(s)  The patient history, physical examination, clinical judgement of the provider and imaging studies are consistent with end stage degenerative joint disease of the left hip(s) and total hip arthroplasty is deemed medically necessary. The treatment options including medical management, injection therapy, arthroscopy and arthroplasty were discussed at length. The risks and benefits of total hip arthroplasty were presented and reviewed. The risks due to aseptic loosening, infection, stiffness, dislocation/subluxation,  thromboembolic complications and other imponderables were discussed.  The patient acknowledged the explanation, agreed to proceed with the plan and consent was signed. Patient is being admitted for inpatient treatment for surgery, pain control, PT, OT, prophylactic antibiotics, VTE prophylaxis, progressive ambulation and ADL's and discharge planning.The patient is planning to be discharged home.   Anticipated LOS equal to or greater than 2 midnights due to - Age 45 and older with one or more of the following:  - Obesity  - Expected need for hospital services (PT, OT, Nursing) required for safe  discharge  - Anticipated need for postoperative skilled nursing care or inpatient rehab  - Active co-morbidities: None OR   - Unanticipated findings during/Post  Surgery: None  - Patient is a high risk of re-admission due to: None   Therapy Plans: HEP Disposition: Home with wife Planned DVT Prophylaxis: Aspirin 325 mg BID DME needed: None PCP: Leighton Ruff, MD TXA: IV Allergies: NKDA Anesthesia Concerns: None BMI: 29  Other: Pt is requesting urinary catheterization at time of surgery, even if has to undergo LMA. Has appointment with Dr. Drema Dallas next week for preoperative clearance.  - Patient was instructed on what medications to stop prior to surgery. - Follow-up visit in 2 weeks with Dr. Wynelle Link - Begin physical therapy following surgery - Pre-operative lab work as pre-surgical testing - Prescriptions will be provided in hospital at time of discharge  Theresa Duty, PA-C Orthopedic Surgery [facility] [place]

## 2018-12-01 DIAGNOSIS — Z01818 Encounter for other preprocedural examination: Secondary | ICD-10-CM | POA: Diagnosis not present

## 2018-12-10 ENCOUNTER — Other Ambulatory Visit (HOSPITAL_COMMUNITY)
Admission: RE | Admit: 2018-12-10 | Discharge: 2018-12-10 | Disposition: A | Discharge status: Home / Self Care | Payer: Medicare Other | Source: Ambulatory Visit | Attending: Orthopedic Surgery | Admitting: Orthopedic Surgery

## 2018-12-10 DIAGNOSIS — Z01812 Encounter for preprocedural laboratory examination: Secondary | ICD-10-CM | POA: Insufficient documentation

## 2018-12-10 DIAGNOSIS — Z20828 Contact with and (suspected) exposure to other viral communicable diseases: Secondary | ICD-10-CM | POA: Insufficient documentation

## 2018-12-10 LAB — SARS CORONAVIRUS 2 (TAT 6-24 HRS): SARS Coronavirus 2: NEGATIVE

## 2018-12-13 ENCOUNTER — Encounter (HOSPITAL_COMMUNITY)
Admission: RE | Admit: 2018-12-13 | Discharge: 2018-12-13 | Disposition: A | Discharge status: Home / Self Care | Payer: Medicare Other | Source: Ambulatory Visit | Attending: Orthopedic Surgery | Admitting: Orthopedic Surgery

## 2018-12-13 ENCOUNTER — Other Ambulatory Visit: Payer: Self-pay

## 2018-12-13 ENCOUNTER — Encounter (HOSPITAL_COMMUNITY): Payer: Self-pay

## 2018-12-13 DIAGNOSIS — Z8719 Personal history of other diseases of the digestive system: Secondary | ICD-10-CM | POA: Diagnosis not present

## 2018-12-13 DIAGNOSIS — Z85828 Personal history of other malignant neoplasm of skin: Secondary | ICD-10-CM | POA: Diagnosis not present

## 2018-12-13 DIAGNOSIS — M1612 Unilateral primary osteoarthritis, left hip: Secondary | ICD-10-CM | POA: Diagnosis not present

## 2018-12-13 DIAGNOSIS — Z7901 Long term (current) use of anticoagulants: Secondary | ICD-10-CM | POA: Diagnosis not present

## 2018-12-13 DIAGNOSIS — Z7982 Long term (current) use of aspirin: Secondary | ICD-10-CM | POA: Diagnosis not present

## 2018-12-13 DIAGNOSIS — Z96641 Presence of right artificial hip joint: Secondary | ICD-10-CM | POA: Diagnosis not present

## 2018-12-13 HISTORY — DX: Other difficulties with micturition: R39.198

## 2018-12-13 LAB — PROTIME-INR
INR: 0.9 (ref 0.8–1.2)
Prothrombin Time: 12.4 seconds (ref 11.4–15.2)

## 2018-12-13 LAB — CBC
HCT: 50.7 % (ref 39.0–52.0)
Hemoglobin: 16.1 g/dL (ref 13.0–17.0)
MCH: 30.8 pg (ref 26.0–34.0)
MCHC: 31.8 g/dL (ref 30.0–36.0)
MCV: 97.1 fL (ref 80.0–100.0)
Platelets: 252 10*3/uL (ref 150–400)
RBC: 5.22 MIL/uL (ref 4.22–5.81)
RDW: 12.6 % (ref 11.5–15.5)
WBC: 6.9 10*3/uL (ref 4.0–10.5)
nRBC: 0 % (ref 0.0–0.2)

## 2018-12-13 LAB — COMPREHENSIVE METABOLIC PANEL
ALT: 20 U/L (ref 0–44)
AST: 18 U/L (ref 15–41)
Albumin: 4.2 g/dL (ref 3.5–5.0)
Alkaline Phosphatase: 93 U/L (ref 38–126)
Anion gap: 6 (ref 5–15)
BUN: 20 mg/dL (ref 8–23)
CO2: 29 mmol/L (ref 22–32)
Calcium: 9.5 mg/dL (ref 8.9–10.3)
Chloride: 107 mmol/L (ref 98–111)
Creatinine, Ser: 0.96 mg/dL (ref 0.61–1.24)
GFR calc Af Amer: >60 mL/min (ref >60)
GFR calc non Af Amer: >60 mL/min (ref >60)
Glucose, Bld: 112 mg/dL — ABNORMAL HIGH (ref 70–99)
Potassium: 4.5 mmol/L (ref 3.5–5.1)
Sodium: 142 mmol/L (ref 135–145)
Total Bilirubin: 0.9 mg/dL (ref 0.3–1.2)
Total Protein: 7.2 g/dL (ref 6.5–8.1)

## 2018-12-13 LAB — APTT: aPTT: 55 seconds — ABNORMAL HIGH (ref 24–36)

## 2018-12-13 LAB — ABO/RH: ABO/RH(D): O POS

## 2018-12-13 LAB — SURGICAL PCR SCREEN
MRSA, PCR: NEGATIVE
Staphylococcus aureus: NEGATIVE

## 2018-12-13 NOTE — Progress Notes (Signed)
   THE FOLLOWING WAS RECEIVED FROM [facility]   SURGICAL CLEARANCE AND LOV DR Benjamine Mola BARNES 11-28-2018 ON CHART   EKG 11-28-2018 ON CHART    HGBA1C 11-28-2018 ON CHART

## 2018-12-13 NOTE — Patient Instructions (Signed)
YOU HAVE COMPLETED YOUR COVID-19 TEST. PLEASE BEGIN THE QUARANTINE INSTRUCTIONS AS OUTLINED IN YOUR HANDOUT.                Kirk Price   Your procedure is scheduled on: 12-14-2018   Report to [place]  Entrance    Report to admitting at 6:00AM   1 [place] DAY OF YOUR SURGERY.    Call this number if you have problems the morning of surgery [name] AND RINSE YOUR MOUTH OUT, NO CHEWING GUM CANDY OR MINTS.   Do not eat food After Midnight. YOU MAY HAVE CLEAR LIQUIDS FROM MIDNIGHT UNTIL 5:30AM. At 5:30AM Please finish the prescribed Pre-Surgery ENSURE drink. Nothing by mouth after you finish the ENSURE drink !   CLEAR LIQUID DIET   Foods Allowed                                                                     Foods Excluded  Coffee and tea, regular and decaf                             liquids that you cannot  Plain Jell-O any favor except red or purple                                           see through such as: Fruit ices (not with fruit pulp)                                     milk, soups, orange juice  Iced Popsicles                                    All solid food Carbonated beverages, regular and diet                                    Cranberry, grape and apple juices Sports drinks like Gatorade Lightly seasoned clear broth or consume(fat free) Sugar, honey syrup  Sample Menu Breakfast                                Lunch                                     Supper Cranberry juice                    Beef broth                            Chicken broth Jell-O  Grape juice                           Apple juice Coffee or tea                        Jell-O                                      Popsicle                                                Coffee or tea                        Coffee or  tea  _____________________________________________________________________     Take these medicines the morning of surgery with A SIP OF WATER: NONE                                You may not have any metal on your body including hair pins and              piercings  Do not wear jewelry, make-up, lotions, powders or perfumes, deodorant                          Men may shave face and neck.   Do not bring valuables to the hospital. [name].  Contacts, dentures or bridgework may not be worn into surgery.                Please read over the following fact sheets you were given: _____________________________________________________________________             [facility] - Preparing for Surgery Before surgery, you can play an important role.  Because skin is not sterile, your skin needs to be as free of germs as possible.  You can reduce the number of germs on your skin by washing with CHG (chlorahexidine gluconate) soap before surgery.  CHG is an antiseptic cleaner which kills germs and bonds with the skin to continue killing germs even after washing. Please DO NOT use if you have an allergy to CHG or antibacterial soaps.  If your skin becomes reddened/irritated stop using the CHG and inform your nurse when you arrive at Short Stay. Do not shave (including legs and underarms) for at least 48 hours prior to the first CHG shower.  You may shave your face/neck. Please follow these instructions carefully:  1.  Shower with CHG Soap the night before surgery and the  morning of Surgery.  2.  If you choose to wash your hair, wash your hair first as usual with your  normal  shampoo.  3.  After you shampoo, rinse your hair and body thoroughly to remove the  shampoo.                           4.  Use CHG as you would any other liquid soap.  You  can apply chg directly  to the skin and wash                       Gently with a scrungie or clean  washcloth.  5.  Apply the CHG Soap to your body ONLY FROM THE NECK DOWN.   Do not use on face/ open                           Wound or open sores. Avoid contact with eyes, ears mouth and genitals (private parts).                       Wash face,  Genitals (private parts) with your normal soap.             6.  Wash thoroughly, paying special attention to the area where your surgery  will be performed.  7.  Thoroughly rinse your body with warm water from the neck down.  8.  DO NOT shower/wash with your normal soap after using and rinsing off  the CHG Soap.                9.  Pat yourself dry with a clean towel.            10.  Wear clean pajamas.            11.  Place clean sheets on your bed the night of your first shower and do not  sleep with pets. Day of Surgery : Do not apply any lotions/deodorants the morning of surgery.  Please wear clean clothes to the hospital/surgery center.  FAILURE TO FOLLOW THESE INSTRUCTIONS MAY RESULT IN THE CANCELLATION OF YOUR SURGERY PATIENT SIGNATURE_________________________________  NURSE SIGNATURE__________________________________  ________________________________________________________________________   Kirk Price  An incentive spirometer is a tool that can help keep your lungs clear and active. This tool measures how well you are filling your lungs with each breath. Taking long deep breaths may help reverse or decrease the chance of developing breathing (pulmonary) problems (especially infection) following:  A long period of time when you are unable to move or be active. BEFORE THE PROCEDURE   If the spirometer includes an indicator to show your best effort, your nurse or respiratory therapist will set it to a desired goal.  If possible, sit up straight or lean slightly forward. Try not to slouch.  Hold the incentive spirometer in an upright position. INSTRUCTIONS FOR USE  1. Sit on the edge of your bed if possible, or sit up as far  as you can in bed or on a chair. 2. Hold the incentive spirometer in an upright position. 3. Breathe out normally. 4. Place the mouthpiece in your mouth and seal your lips tightly around it. 5. Breathe in slowly and as deeply as possible, raising the piston or the ball toward the top of the column. 6. Hold your breath for 3-5 seconds or for as long as possible. Allow the piston or ball to fall to the bottom of the column. 7. Remove the mouthpiece from your mouth and breathe out normally. 8. Rest for a few seconds and repeat Steps 1 through 7 at least 10 times every 1-2 hours when you are awake. Take your time and take a few normal breaths between deep breaths. 9. The spirometer may include an indicator to show your best effort. Use the indicator as a goal to work toward  during each repetition. 10. After each set of 10 deep breaths, practice coughing to be sure your lungs are clear. If you have an incision (the cut made at the time of surgery), support your incision when coughing by placing a pillow or rolled up towels firmly against it. Once you are able to get out of bed, walk around indoors and cough well. You may stop using the incentive spirometer when instructed by your caregiver.  RISKS AND COMPLICATIONS  Take your time so you do not get dizzy or light-headed.  If you are in pain, you may need to take or ask for pain medication before doing incentive spirometry. It is harder to take a deep breath if you are having pain. AFTER USE  Rest and breathe slowly and easily.  It can be helpful to keep track of a log of your progress. Your caregiver can provide you with a simple table to help with this. If you are using the spirometer at home, follow these instructions: [place] IF:   You are having difficultly using the spirometer.  You have trouble using the spirometer as often as instructed.  Your pain medication is not giving enough relief while using the spirometer.  You  develop fever of 100.5 F (38.1 C) or higher. SEEK IMMEDIATE MEDICAL CARE IF:   You cough up bloody sputum that had not been present before.  You develop fever of 102 F (38.9 C) or greater.  You develop worsening pain at or near the incision site. MAKE SURE YOU:   Understand these instructions.  Will watch your condition.  Will get help right away if you are not doing well or get worse. Document Released: 08/31/2006 Document Revised: 07/13/2011 Document Reviewed: 11/01/2006 Exit[employer] Patient Information 2014 Exit[employer], [place].   ________________________________________________________________________  WHAT IS A BLOOD TRANSFUSION? Blood Transfusion Information  A transfusion is the replacement of blood or some of its parts. Blood is made up of multiple cells which provide different functions.  Red blood cells carry oxygen and are used for blood loss replacement.  White blood cells fight against infection.  Platelets control bleeding.  Plasma helps clot blood.  Other blood products are available for specialized needs, such as hemophilia or other clotting disorders. BEFORE THE TRANSFUSION  Who gives blood for transfusions?   Healthy volunteers who are fully evaluated to make sure their blood is safe. This is blood bank blood. Transfusion therapy is the safest it has ever been in the practice of medicine. Before blood is taken from a donor, a complete history is taken to make sure that person has no history of diseases nor engages in risky social behavior (examples are intravenous drug use or sexual activity with multiple partners). The donor's travel history is screened to minimize risk of transmitting infections, such as malaria. The donated blood is tested for signs of infectious diseases, such as HIV and hepatitis. The blood is then tested to be sure it is compatible with you in order to minimize the chance of a transfusion reaction. If you or a relative donates blood, this is  often done in anticipation of surgery and is not appropriate for emergency situations. It takes many days to process the donated blood. RISKS AND COMPLICATIONS Although transfusion therapy is very safe and saves many lives, the main dangers of transfusion include:   Getting an infectious disease.  Developing a transfusion reaction. This is an allergic reaction to something in the blood you were given. Every precaution is taken to  prevent this. The decision to have a blood transfusion has been considered carefully by your caregiver before blood is given. Blood is not given unless the benefits outweigh the risks. AFTER THE TRANSFUSION  Right after receiving a blood transfusion, you will usually feel much better and more energetic. This is especially true if your red blood cells have gotten low (anemic). The transfusion raises the level of the red blood cells which carry oxygen, and this usually causes an energy increase.  The nurse administering the transfusion will monitor you carefully for complications. HOME CARE INSTRUCTIONS  No special instructions are needed after a transfusion. You may find your energy is better. Speak with your caregiver about any limitations on activity for underlying diseases you may have. SEEK MEDICAL CARE IF:   Your condition is not improving after your transfusion.  You develop redness or irritation at the intravenous (IV) site. SEEK IMMEDIATE MEDICAL CARE IF:  Any of the following symptoms occur over the next 12 hours:  Shaking chills.  You have a temperature by mouth above 102 F (38.9 C), not controlled by medicine.  Chest, back, or muscle pain.  People around you feel you are not acting correctly or are confused.  Shortness of breath or difficulty breathing.  Dizziness and fainting.  You get a rash or develop hives.  You have a decrease in urine output.  Your urine turns a dark color or changes to pink, red, or brown. Any of the following  symptoms occur over the next 10 days:  You have a temperature by mouth above 102 F (38.9 C), not controlled by medicine.  Shortness of breath.  Weakness after normal activity.  The white part of the eye turns yellow (jaundice).  You have a decrease in the amount of urine or are urinating less often.  Your urine turns a dark color or changes to pink, red, or brown. Document Released: 04/17/2000 Document Revised: 07/13/2011 Document Reviewed: 12/05/2007 [facility] Patient Information 2014 [place], [place].  _______________________________________________________________________

## 2018-12-14 ENCOUNTER — Encounter (HOSPITAL_COMMUNITY)
Admission: RE | Disposition: A | Discharge status: Home / Self Care | Payer: Self-pay | Source: Home / Self Care | Attending: Orthopedic Surgery

## 2018-12-14 ENCOUNTER — Inpatient Hospital Stay (HOSPITAL_COMMUNITY): Payer: Medicare Other | Admitting: Physician Assistant

## 2018-12-14 ENCOUNTER — Inpatient Hospital Stay (HOSPITAL_COMMUNITY): Payer: Medicare Other

## 2018-12-14 ENCOUNTER — Observation Stay (HOSPITAL_COMMUNITY)
Admission: RE | Admit: 2018-12-14 | Discharge: 2018-12-15 | Disposition: A | Discharge status: Home / Self Care | Payer: Medicare Other | Attending: Orthopedic Surgery | Admitting: Orthopedic Surgery

## 2018-12-14 ENCOUNTER — Inpatient Hospital Stay (HOSPITAL_COMMUNITY): Payer: Medicare Other | Admitting: Anesthesiology

## 2018-12-14 ENCOUNTER — Encounter (HOSPITAL_COMMUNITY): Payer: Self-pay | Admitting: Emergency Medicine

## 2018-12-14 ENCOUNTER — Other Ambulatory Visit: Payer: Self-pay

## 2018-12-14 DIAGNOSIS — Z7901 Long term (current) use of anticoagulants: Secondary | ICD-10-CM | POA: Insufficient documentation

## 2018-12-14 DIAGNOSIS — Z96649 Presence of unspecified artificial hip joint: Secondary | ICD-10-CM

## 2018-12-14 DIAGNOSIS — Z8719 Personal history of other diseases of the digestive system: Secondary | ICD-10-CM | POA: Insufficient documentation

## 2018-12-14 DIAGNOSIS — Z85828 Personal history of other malignant neoplasm of skin: Secondary | ICD-10-CM | POA: Insufficient documentation

## 2018-12-14 DIAGNOSIS — M169 Osteoarthritis of hip, unspecified: Secondary | ICD-10-CM | POA: Diagnosis present

## 2018-12-14 DIAGNOSIS — Z471 Aftercare following joint replacement surgery: Secondary | ICD-10-CM | POA: Diagnosis not present

## 2018-12-14 DIAGNOSIS — M1612 Unilateral primary osteoarthritis, left hip: Secondary | ICD-10-CM | POA: Diagnosis not present

## 2018-12-14 DIAGNOSIS — Z7982 Long term (current) use of aspirin: Secondary | ICD-10-CM | POA: Diagnosis not present

## 2018-12-14 DIAGNOSIS — Z96642 Presence of left artificial hip joint: Secondary | ICD-10-CM | POA: Diagnosis not present

## 2018-12-14 DIAGNOSIS — Z419 Encounter for procedure for purposes other than remedying health state, unspecified: Secondary | ICD-10-CM

## 2018-12-14 DIAGNOSIS — Z96641 Presence of right artificial hip joint: Secondary | ICD-10-CM | POA: Diagnosis not present

## 2018-12-14 HISTORY — PX: TOTAL HIP ARTHROPLASTY: SHX124

## 2018-12-14 LAB — TYPE AND SCREEN
ABO/RH(D): O POS
Antibody Screen: NEGATIVE

## 2018-12-14 SURGERY — ARTHROPLASTY, HIP, TOTAL, ANTERIOR APPROACH
Anesthesia: Spinal | Laterality: Left

## 2018-12-14 MED ORDER — PHENOL 1.4 % MT LIQD
1.0000 | OROMUCOSAL | Status: DC | PRN
  Filled 2018-12-14: qty 177

## 2018-12-14 MED ORDER — BUPIVACAINE-EPINEPHRINE (PF) 0.25% -1:200000 IJ SOLN
INTRAMUSCULAR | Status: AC
  Filled 2018-12-14: qty 30

## 2018-12-14 MED ORDER — MORPHINE SULFATE (PF) 2 MG/ML IV SOLN: 0.5000 mg | INTRAVENOUS | Status: DC | PRN

## 2018-12-14 MED ORDER — SODIUM CHLORIDE 0.9 % IV SOLN
INTRAVENOUS | Status: DC | PRN
  Administered 2018-12-14: 09:00:00 40 ug/min via INTRAVENOUS

## 2018-12-14 MED ORDER — DOCUSATE SODIUM 100 MG PO CAPS
100.0000 mg | ORAL_CAPSULE | Freq: Two times a day (BID) | ORAL | Status: DC
  Administered 2018-12-14 – 2018-12-15 (×2): 100 mg via ORAL
  Filled 2018-12-14 (×2): qty 1

## 2018-12-14 MED ORDER — PHENYLEPHRINE HCL (PRESSORS) 10 MG/ML IV SOLN
INTRAVENOUS | Status: AC
  Filled 2018-12-14: qty 1

## 2018-12-14 MED ORDER — TRANEXAMIC ACID-NACL 1000-0.7 MG/100ML-% IV SOLN
1000.0000 mg | INTRAVENOUS | Status: AC
  Administered 2018-12-14: 09:00:00 1000 mg via INTRAVENOUS
  Filled 2018-12-14: qty 100

## 2018-12-14 MED ORDER — METHOCARBAMOL 500 MG PO TABS: 500.0000 mg | ORAL_TABLET | Freq: Four times a day (QID) | ORAL | Status: DC | PRN

## 2018-12-14 MED ORDER — EPHEDRINE SULFATE-NACL 50-0.9 MG/10ML-% IV SOSY
PREFILLED_SYRINGE | INTRAVENOUS | Status: DC | PRN
  Administered 2018-12-14 (×3): 10 mg via INTRAVENOUS

## 2018-12-14 MED ORDER — METHOCARBAMOL 500 MG IVPB - SIMPLE MED
500.0000 mg | Freq: Four times a day (QID) | INTRAVENOUS | Status: DC | PRN
  Administered 2018-12-14: 10:00:00 500 mg via INTRAVENOUS
  Filled 2018-12-14: qty 50

## 2018-12-14 MED ORDER — SODIUM CHLORIDE 0.9 % IV SOLN
INTRAVENOUS | Status: DC
  Administered 2018-12-14 (×3): via INTRAVENOUS

## 2018-12-14 MED ORDER — ONDANSETRON HCL 4 MG/2ML IJ SOLN: 4.0000 mg | Freq: Once | INTRAMUSCULAR | Status: DC | PRN

## 2018-12-14 MED ORDER — POVIDONE-IODINE 10 % EX SWAB
2.0000 "application " | Freq: Once | CUTANEOUS | Status: AC
  Administered 2018-12-14: 2 via TOPICAL

## 2018-12-14 MED ORDER — FLEET ENEMA 7-19 GM/118ML RE ENEM: 1.0000 | ENEMA | Freq: Once | RECTAL | Status: DC | PRN

## 2018-12-14 MED ORDER — FENTANYL CITRATE (PF) 100 MCG/2ML IJ SOLN
INTRAMUSCULAR | Status: AC
  Filled 2018-12-14: qty 2

## 2018-12-14 MED ORDER — CEFAZOLIN SODIUM-DEXTROSE 2-4 GM/100ML-% IV SOLN
2.0000 g | INTRAVENOUS | Status: AC
  Administered 2018-12-14: 2 g via INTRAVENOUS
  Filled 2018-12-14: qty 100

## 2018-12-14 MED ORDER — TRAMADOL HCL 50 MG PO TABS: 50.0000 mg | ORAL_TABLET | Freq: Four times a day (QID) | ORAL | Status: DC | PRN

## 2018-12-14 MED ORDER — METOCLOPRAMIDE HCL 5 MG/ML IJ SOLN: 5.0000 mg | Freq: Three times a day (TID) | INTRAMUSCULAR | Status: DC | PRN

## 2018-12-14 MED ORDER — FENTANYL CITRATE (PF) 100 MCG/2ML IJ SOLN
INTRAMUSCULAR | Status: DC | PRN
  Administered 2018-12-14: 100 ug via INTRAVENOUS

## 2018-12-14 MED ORDER — CEFAZOLIN SODIUM-DEXTROSE 2-4 GM/100ML-% IV SOLN
2.0000 g | Freq: Four times a day (QID) | INTRAVENOUS | Status: AC
  Administered 2018-12-14 (×2): 2 g via INTRAVENOUS
  Filled 2018-12-14 (×2): qty 100

## 2018-12-14 MED ORDER — BISACODYL 10 MG RE SUPP: 10.0000 mg | Freq: Every day | RECTAL | Status: DC | PRN

## 2018-12-14 MED ORDER — MENTHOL 3 MG MT LOZG: 1.0000 | LOZENGE | OROMUCOSAL | Status: DC | PRN

## 2018-12-14 MED ORDER — ONDANSETRON HCL 4 MG/2ML IJ SOLN
INTRAMUSCULAR | Status: AC
  Filled 2018-12-14: qty 2

## 2018-12-14 MED ORDER — POLYETHYLENE GLYCOL 3350 17 G PO PACK: 17.0000 g | PACK | Freq: Every day | ORAL | Status: DC | PRN

## 2018-12-14 MED ORDER — FENTANYL CITRATE (PF) 100 MCG/2ML IJ SOLN
25.0000 ug | INTRAMUSCULAR | Status: DC | PRN
  Administered 2018-12-14 (×2): 50 ug via INTRAVENOUS

## 2018-12-14 MED ORDER — METHOCARBAMOL 500 MG IVPB - SIMPLE MED
INTRAVENOUS | Status: AC
  Filled 2018-12-14: qty 50

## 2018-12-14 MED ORDER — ACETAMINOPHEN 10 MG/ML IV SOLN
1000.0000 mg | Freq: Four times a day (QID) | INTRAVENOUS | Status: DC
  Administered 2018-12-14: 1000 mg via INTRAVENOUS
  Filled 2018-12-14: qty 100

## 2018-12-14 MED ORDER — BUPIVACAINE-EPINEPHRINE (PF) 0.25% -1:200000 IJ SOLN
INTRAMUSCULAR | Status: DC | PRN
  Administered 2018-12-14: 30 mL

## 2018-12-14 MED ORDER — ACETAMINOPHEN 500 MG PO TABS
500.0000 mg | ORAL_TABLET | Freq: Four times a day (QID) | ORAL | Status: DC
  Administered 2018-12-14 (×2): 500 mg via ORAL
  Filled 2018-12-14 (×2): qty 1

## 2018-12-14 MED ORDER — CHLORHEXIDINE GLUCONATE 4 % EX LIQD: 60.0000 mL | Freq: Once | CUTANEOUS | Status: DC

## 2018-12-14 MED ORDER — PROPOFOL 500 MG/50ML IV EMUL
INTRAVENOUS | Status: DC | PRN
  Administered 2018-12-14: 100 ug/kg/min via INTRAVENOUS

## 2018-12-14 MED ORDER — ASPIRIN EC 325 MG PO TBEC
325.0000 mg | DELAYED_RELEASE_TABLET | Freq: Two times a day (BID) | ORAL | Status: DC
  Administered 2018-12-15: 325 mg via ORAL
  Filled 2018-12-14: qty 1

## 2018-12-14 MED ORDER — HYDROCODONE-ACETAMINOPHEN 5-325 MG PO TABS
1.0000 | ORAL_TABLET | ORAL | Status: DC | PRN
  Administered 2018-12-14: 1 via ORAL
  Administered 2018-12-14 – 2018-12-15 (×2): 2 via ORAL
  Filled 2018-12-14: qty 1
  Filled 2018-12-14 (×2): qty 2

## 2018-12-14 MED ORDER — DEXAMETHASONE SODIUM PHOSPHATE 10 MG/ML IJ SOLN
10.0000 mg | Freq: Once | INTRAMUSCULAR | Status: AC
  Administered 2018-12-15: 09:00:00 10 mg via INTRAVENOUS
  Filled 2018-12-14: qty 1

## 2018-12-14 MED ORDER — PROPOFOL 10 MG/ML IV BOLUS
INTRAVENOUS | Status: AC
  Filled 2018-12-14: qty 60

## 2018-12-14 MED ORDER — DEXAMETHASONE SODIUM PHOSPHATE 10 MG/ML IJ SOLN: 8.0000 mg | Freq: Once | INTRAMUSCULAR | Status: DC

## 2018-12-14 MED ORDER — PROPOFOL 10 MG/ML IV BOLUS
INTRAVENOUS | Status: DC | PRN
  Administered 2018-12-14: 20 mg via INTRAVENOUS

## 2018-12-14 MED ORDER — METOCLOPRAMIDE HCL 5 MG PO TABS: 5.0000 mg | ORAL_TABLET | Freq: Three times a day (TID) | ORAL | Status: DC | PRN

## 2018-12-14 MED ORDER — EPHEDRINE 5 MG/ML INJ
INTRAVENOUS | Status: AC
  Filled 2018-12-14: qty 10

## 2018-12-14 MED ORDER — BUPIVACAINE IN DEXTROSE 0.75-8.25 % IT SOLN
INTRATHECAL | Status: DC | PRN
  Administered 2018-12-14: 1.8 mL via INTRATHECAL

## 2018-12-14 MED ORDER — ONDANSETRON HCL 4 MG PO TABS: 4.0000 mg | ORAL_TABLET | Freq: Four times a day (QID) | ORAL | Status: DC | PRN

## 2018-12-14 MED ORDER — DEXAMETHASONE SODIUM PHOSPHATE 10 MG/ML IJ SOLN
INTRAMUSCULAR | Status: DC | PRN
  Administered 2018-12-14: 10 mg via INTRAVENOUS

## 2018-12-14 MED ORDER — SODIUM CHLORIDE 0.9 % IR SOLN
Status: DC | PRN
  Administered 2018-12-14: 1000 mL

## 2018-12-14 MED ORDER — MIDAZOLAM HCL 5 MG/5ML IJ SOLN
INTRAMUSCULAR | Status: DC | PRN
  Administered 2018-12-14: 2 mg via INTRAVENOUS

## 2018-12-14 MED ORDER — DIPHENHYDRAMINE HCL 12.5 MG/5ML PO ELIX: 12.5000 mg | ORAL_SOLUTION | ORAL | Status: DC | PRN

## 2018-12-14 MED ORDER — LACTATED RINGERS IV SOLN
INTRAVENOUS | Status: DC
  Administered 2018-12-14: 07:00:00 via INTRAVENOUS

## 2018-12-14 MED ORDER — ONDANSETRON HCL 4 MG/2ML IJ SOLN: 4.0000 mg | Freq: Four times a day (QID) | INTRAMUSCULAR | Status: DC | PRN

## 2018-12-14 MED ORDER — MIDAZOLAM HCL 2 MG/2ML IJ SOLN
INTRAMUSCULAR | Status: AC
  Filled 2018-12-14: qty 2

## 2018-12-14 MED ORDER — ONDANSETRON HCL 4 MG/2ML IJ SOLN
INTRAMUSCULAR | Status: DC | PRN
  Administered 2018-12-14: 4 mg via INTRAVENOUS

## 2018-12-14 MED ORDER — DEXAMETHASONE SODIUM PHOSPHATE 10 MG/ML IJ SOLN
INTRAMUSCULAR | Status: AC
  Filled 2018-12-14: qty 1

## 2018-12-14 MED ORDER — LIDOCAINE 2% (20 MG/ML) 5 ML SYRINGE
INTRAMUSCULAR | Status: AC
  Filled 2018-12-14: qty 5

## 2018-12-14 SURGICAL SUPPLY — 46 items
BAG DECANTER FOR FLEXI CONT (MISCELLANEOUS) IMPLANT
BAG SPEC THK2 15X12 ZIP CLS (MISCELLANEOUS)
BAG ZIPLOCK 12X15 (MISCELLANEOUS) IMPLANT
BLADE SAG 18X100X1.27 (BLADE) ×3 IMPLANT
CLOSURE WOUND 1/2 X4 (GAUZE/BANDAGES/DRESSINGS) ×2
COVER PERINEAL POST (MISCELLANEOUS) ×3 IMPLANT
COVER SURGICAL LIGHT HANDLE (MISCELLANEOUS) ×3 IMPLANT
COVER WAND RF STERILE (DRAPES) IMPLANT
CUP ACETBLR 54 OD PINNACLE (Hips) ×3 IMPLANT
DECANTER SPIKE VIAL GLASS SM (MISCELLANEOUS) ×3 IMPLANT
DRAPE STERI IOBAN 125X83 (DRAPES) ×3 IMPLANT
DRAPE U-SHAPE 47X51 STRL (DRAPES) ×6 IMPLANT
DRSG ADAPTIC 3X8 NADH LF (GAUZE/BANDAGES/DRESSINGS) ×3 IMPLANT
DRSG MEPILEX BORDER 4X4 (GAUZE/BANDAGES/DRESSINGS) ×3 IMPLANT
DRSG MEPILEX BORDER 4X8 (GAUZE/BANDAGES/DRESSINGS) ×3 IMPLANT
DURAPREP 26ML APPLICATOR (WOUND CARE) ×3 IMPLANT
ELECT REM PT RETURN 15FT ADLT (MISCELLANEOUS) ×3 IMPLANT
EVACUATOR 1/8 PVC DRAIN (DRAIN) ×3 IMPLANT
GLOVE BIO SURGEON STRL SZ 6 (GLOVE) ×3 IMPLANT
GLOVE BIO SURGEON STRL SZ7 (GLOVE) IMPLANT
GLOVE BIO SURGEON STRL SZ8 (GLOVE) ×3 IMPLANT
GLOVE BIOGEL PI IND STRL 6.5 (GLOVE) ×1 IMPLANT
GLOVE BIOGEL PI IND STRL 7.0 (GLOVE) IMPLANT
GLOVE BIOGEL PI IND STRL 8 (GLOVE) ×1 IMPLANT
GLOVE BIOGEL PI INDICATOR 6.5 (GLOVE) ×2
GLOVE BIOGEL PI INDICATOR 7.0 (GLOVE)
GLOVE BIOGEL PI INDICATOR 8 (GLOVE) ×2
GOWN STRL REUS W/TWL LRG LVL3 (GOWN DISPOSABLE) ×3 IMPLANT
GOWN STRL REUS W/TWL XL LVL3 (GOWN DISPOSABLE) ×3 IMPLANT
HEAD CERAMIC DELTA 36 PLUS 1.5 (Hips) ×3 IMPLANT
HOLDER FOLEY CATH W/STRAP (MISCELLANEOUS) ×3 IMPLANT
KIT TURNOVER KIT A (KITS) IMPLANT
LINER MARATHON NEUT +4X54X36 (Hips) ×3 IMPLANT
MANIFOLD NEPTUNE II (INSTRUMENTS) ×3 IMPLANT
PACK ANTERIOR HIP CUSTOM (KITS) ×3 IMPLANT
STEM FEMORAL SZ6 HIGH ACTIS (Stem) ×3 IMPLANT
STRIP CLOSURE SKIN 1/2X4 (GAUZE/BANDAGES/DRESSINGS) ×4 IMPLANT
SUT ETHIBOND NAB CT1 #1 30IN (SUTURE) ×3 IMPLANT
SUT MNCRL AB 4-0 PS2 18 (SUTURE) ×3 IMPLANT
SUT STRATAFIX 0 PDS 27 VIOLET (SUTURE) ×3
SUT VIC AB 2-0 CT1 27 (SUTURE) ×4
SUT VIC AB 2-0 CT1 TAPERPNT 27 (SUTURE) ×2 IMPLANT
SUTURE STRATFX 0 PDS 27 VIOLET (SUTURE) ×1 IMPLANT
SYR 50ML LL SCALE MARK (SYRINGE) IMPLANT
TRAY FOLEY MTR SLVR 16FR STAT (SET/KITS/TRAYS/PACK) ×3 IMPLANT
YANKAUER SUCT BULB TIP 10FT TU (MISCELLANEOUS) ×3 IMPLANT

## 2018-12-14 NOTE — Plan of Care (Signed)
Continue POC

## 2018-12-14 NOTE — Anesthesia Procedure Notes (Signed)
Date/Time: 12/14/2018 8:23 AM Performed by: Sharlette Dense, CRNA Oxygen Delivery Method: Simple face mask

## 2018-12-14 NOTE — Op Note (Signed)
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   POSTOPERATIVE DIAGNOSIS: Osteoarthritis of the Left  hip.   PROCEDURE: Left total hip arthroplasty, anterior approach.   SURGEON: Gaynelle Arabian, MD   ASSISTANT: Theresa Duty, PA-C  ANESTHESIA:  Spinal  ESTIMATED BLOOD LOSS:-150 mL    DRAINS: Hemovac x1.   COMPLICATIONS: None   CONDITION: PACU - hemodynamically stable.   BRIEF CLINICAL NOTE: Kirk Price is a 68 y.o. male who has advanced end-  stage arthritis of their Left  hip with progressively worsening pain and  dysfunction.The patient has failed nonoperative management and presents for  total hip arthroplasty.   PROCEDURE IN DETAIL: After successful administration of spinal  anesthetic, the traction boots for the [facility] bed were placed on both  feet and the patient was placed onto the [place] bed, boots placed into the leg  holders. The Left hip was then isolated from the perineum with plastic  drapes and prepped and draped in the usual sterile fashion. ASIS and  greater trochanter were marked and a oblique incision was made, starting  at about 1 cm lateral and 2 cm distal to the ASIS and coursing towards  the anterior cortex of the femur. The skin was cut with a 10 blade  through subcutaneous tissue to the level of the fascia overlying the  tensor fascia lata muscle. The fascia was then incised in line with the  incision at the junction of the anterior third and posterior 2/3rd. The  muscle was teased off the fascia and then the interval between the TFL  and the rectus was developed. The Hohmann retractor was then placed at  the top of the femoral neck over the capsule. The vessels overlying the  capsule were cauterized and the fat on top of the capsule was removed.  A Hohmann retractor was then placed anterior underneath the rectus  femoris to give exposure to the entire anterior capsule. A T-shaped   capsulotomy was performed. The edges were tagged and the femoral head  was identified.       Osteophytes are removed off the superior acetabulum.  The femoral neck was then cut in situ with an oscillating saw. Traction  was then applied to the left lower extremity utilizing the [facility]  traction. The femoral head was then removed. Retractors were placed  around the acetabulum and then circumferential removal of the labrum was  performed. Osteophytes were also removed. Reaming starts at 49 mm to  medialize and  Increased in 2 mm increments to 53 mm. We reamed in  approximately 40 degrees of abduction, 20 degrees anteversion. A 54 mm  pinnacle acetabular shell was then impacted in anatomic position under  fluoroscopic guidance with excellent purchase. We did not need to place  any additional dome screws. A 36 mm neutral + 4 marathon liner was then  placed into the acetabular shell.       The femoral lift was then placed along the lateral aspect of the femur  just distal to the vastus ridge. The leg was  externally rotated and capsule  was stripped off the inferior aspect of the femoral neck down to the  level of the lesser trochanter, this was done with electrocautery. The femur was lifted after this was performed. The  leg was then placed in an extended and adducted position essentially delivering the femur. We also removed the capsule superiorly and the piriformis from the piriformis  fossa to gain excellent exposure of the  proximal femur. Rongeur was used to remove some cancellous bone to get  into the lateral portion of the proximal femur for placement of the  initial starter reamer. The starter broaches was placed  the starter broach  and was shown to go down the center of the canal. Broaching  with the Actis system was then performed starting at size 0  coursing  Up to size 6. A size 6 had excellent torsional and rotational  and axial stability. The trial high offset neck was then placed   with a 36 + 1.5 trial head. The hip was then reduced. We confirmed that  the stem was in the canal both on AP and lateral x-rays. It also has excellent sizing. The hip was reduced with outstanding stability through full extension and full external rotation.. AP pelvis was taken and the leg lengths were measured and found to be equal. Hip was then dislocated again and the femoral head and neck removed. The  femoral broach was removed. Size 6 Actis stem with a high offset  neck was then impacted into the femur following native anteversion. Has  excellent purchase in the canal. Excellent torsional and rotational and  axial stability. It is confirmed to be in the canal on AP and lateral  fluoroscopic views. The 36 + 1.5 ceramic head was placed and the hip  reduced with outstanding stability. Again AP pelvis was taken and it  confirmed that the leg lengths were equal. The wound was then copiously  irrigated with saline solution and the capsule reattached and repaired  with Ethibond suture. 30 ml of .25% Bupivicaine was  injected into the capsule and into the edge of the tensor fascia lata as well as subcutaneous tissue. The fascia overlying the tensor fascia lata was then closed with a running #1 V-Loc. Subcu was closed with interrupted 2-0 Vicryl and subcuticular running 4-0 Monocryl. Incision was cleaned  and dried. Steri-Strips and a bulky sterile dressing applied. Hemovac  drain was hooked to suction and then the patient was awakened and transported to  recovery in stable condition.        Please note that a surgical assistant was a medical necessity for this procedure to perform it in a safe and expeditious manner. Assistant was necessary to provide appropriate retraction of vital neurovascular structures and to prevent femoral fracture and allow for anatomic placement of the prosthesis.  Gaynelle Arabian, M.D.

## 2018-12-14 NOTE — Anesthesia Postprocedure Evaluation (Signed)
Anesthesia Post Note  Patient: Kirk Price  Procedure(s) Performed: TOTAL HIP ARTHROPLASTY ANTERIOR APPROACH (Left )     Patient location during evaluation: PACU Anesthesia Type: Spinal Level of consciousness: awake and alert Pain management: pain level controlled Vital Signs Assessment: post-procedure vital signs reviewed and stable Respiratory status: spontaneous breathing and respiratory function stable Cardiovascular status: blood pressure returned to baseline and stable Postop Assessment: spinal receding Anesthetic complications: no    Last Vitals:  Vitals:   12/14/18 1120 12/14/18 1318  BP: (!) 142/78 114/69  Pulse: 60 61  Resp: 16 16  Temp: 37.2 C 36.4 C  SpO2: 99% 100%    Last Pain:  Vitals:   12/14/18 1318  TempSrc: Oral  PainSc:                  Tiajuana Amass

## 2018-12-14 NOTE — Discharge Instructions (Signed)
°Dr. Frank Aluisio °Total Joint Specialist °[facility] °[address] °[place], [place] [address] °[phone] ° °ANTERIOR APPROACH TOTAL HIP REPLACEMENT POSTOPERATIVE DIRECTIONS ° ° °Hip Rehabilitation, Guidelines Following Surgery  °The results of a hip operation are greatly improved after range of motion and muscle strengthening exercises. Follow all safety measures which are given to protect your hip. If any of these exercises cause increased pain or swelling in your joint, decrease the amount until you are comfortable again. Then slowly increase the exercises. Call your caregiver if you have problems or questions.  ° °HOME CARE INSTRUCTIONS  °• Remove items at home which could result in a fall. This includes throw rugs or furniture in walking pathways.  °· ICE to the affected hip every three hours for 30 minutes at a time and then as needed for pain and swelling.  Continue to use ice on the hip for pain and swelling from surgery. You may notice swelling that will progress down to the foot and ankle.  This is normal after surgery.  Elevate the leg when you are not up walking on it.   °· Continue to use the breathing machine which will help keep your temperature down.  It is common for your temperature to cycle up and down following surgery, especially at night when you are not up moving around and exerting yourself.  The breathing machine keeps your lungs expanded and your temperature down. ° °DIET °You may resume your previous home diet once your are discharged from the hospital. ° °DRESSING / WOUND CARE / SHOWERING °You may change your dressing 3-5 days after surgery.  Then change the dressing every day with sterile gauze.  Please use good hand washing techniques before changing the dressing.  Do not use any lotions or creams on the incision until instructed by your surgeon. °You may start showering once you are discharged home but do not submerge the incision under water. Just pat the  incision dry and apply a dry gauze dressing on daily. °Change the surgical dressing daily and reapply a dry dressing each time. ° °ACTIVITY °Walk with your walker as instructed. °Use walker as long as suggested by your caregivers. °Avoid periods of inactivity such as sitting longer than an hour when not asleep. This helps prevent blood clots.  °You may resume a sexual relationship in one month or when given the OK by your doctor.  °You may return to work once you are cleared by your doctor.  °Do not drive a car for 6 weeks or until released by you surgeon.  °Do not drive while taking narcotics. ° °WEIGHT BEARING °Weight bearing as tolerated with assist device (walker, cane, etc) as directed, use it as long as suggested by your surgeon or therapist, typically at least 4-6 weeks. ° °POSTOPERATIVE CONSTIPATION PROTOCOL °Constipation - defined medically as fewer than three stools per week and severe constipation as less than one stool per week. ° °One of the most common issues patients have following surgery is constipation.  Even if you have a regular bowel pattern at home, your normal regimen is likely to be disrupted due to multiple reasons following surgery.  Combination of anesthesia, postoperative narcotics, change in appetite and fluid intake all can affect your bowels.  In order to avoid complications following surgery, here are some recommendations in order to help you during your recovery period. ° °Colace (docusate) - Pick up an over-the-counter form of Colace or another stool softener and take twice a day   as long as you are requiring postoperative pain medications.  Take with a full glass of water daily.  If you experience loose stools or diarrhea, hold the colace until you stool forms back up.  If your symptoms do not get better within 1 week or if they get worse, check with your doctor. ° °Dulcolax (bisacodyl) - Pick up over-the-counter and take as directed by the product packaging as needed to assist with  the movement of your bowels.  Take with a full glass of water.  Use this product as needed if not relieved by Colace only.  ° °MiraLax (polyethylene glycol) - Pick up over-the-counter to have on hand.  MiraLax is a solution that will increase the amount of water in your bowels to assist with bowel movements.  Take as directed and can mix with a glass of water, juice, soda, coffee, or tea.  Take if you go more than two days without a movement. °Do not use MiraLax more than once per day. Call your doctor if you are still constipated or irregular after using this medication for 7 days in a row. ° °If you continue to have problems with postoperative constipation, please contact the office for further assistance and recommendations.  If you experience "the worst abdominal pain ever" or develop nausea or vomiting, please contact the office immediatly for further recommendations for treatment. ° °ITCHING ° If you experience itching with your medications, try taking only a single pain pill, or even half a pain pill at a time.  You can also use Benadryl over the counter for itching or also to help with sleep.  ° °TED HOSE STOCKINGS °Wear the elastic stockings on both legs for three weeks following surgery during the day but you may remove then at night for sleeping. ° °MEDICATIONS °See your medication summary on the “After Visit Summary” that the nursing staff will review with you prior to discharge.  You may have some home medications which will be placed on hold until you complete the course of blood thinner medication.  It is important for you to complete the blood thinner medication as prescribed by your surgeon.  Continue your approved medications as instructed at time of discharge. ° °PRECAUTIONS °If you experience chest pain or shortness of breath - call 911 immediately for transfer to the hospital emergency department.  °If you develop a fever greater that 101 F, purulent drainage from wound, increased redness or  drainage from wound, foul odor from the wound/dressing, or calf pain - CONTACT YOUR SURGEON.   °                                                °FOLLOW-UP APPOINTMENTS °Make sure you keep all of your appointments after your operation with your surgeon and caregivers. You should call the office at the above phone number and make an appointment for approximately two weeks after the date of your surgery or on the date instructed by your surgeon outlined in the "After Visit Summary". ° °RANGE OF MOTION AND STRENGTHENING EXERCISES  °These exercises are designed to help you keep full movement of your hip joint. Follow your caregiver's or physical therapist's instructions. Perform all exercises about fifteen times, three times per day or as directed. Exercise both hips, even if you have had only one joint replacement. These exercises can be done on   a training (exercise) mat, on the floor, on a table or on a bed. Use whatever works the best and is most comfortable for you. Use music or television while you are exercising so that the exercises are a pleasant break in your day. This will make your life better with the exercises acting as a break in routine you can look forward to.   Lying on your back, slowly slide your foot toward your buttocks, raising your knee up off the floor. Then slowly slide your foot back down until your leg is straight again.   Lying on your back spread your legs as far apart as you can without causing discomfort.   Lying on your side, raise your upper leg and foot straight up from the floor as far as is comfortable. Slowly lower the leg and repeat.   Lying on your back, tighten up the muscle in the front of your thigh (quadriceps muscles). You can do this by keeping your leg straight and trying to raise your heel off the floor. This helps strengthen the largest muscle supporting your knee.   Lying on your back, tighten up the muscles of your buttocks both with the legs straight and with  the knee bent at a comfortable angle while keeping your heel on the floor.   IF YOU ARE TRANSFERRED TO A SKILLED REHAB FACILITY If the patient is transferred to a skilled rehab facility following release from the hospital, a list of the current medications will be sent to the facility for the patient to continue.  When discharged from the skilled rehab facility, please have the facility set up the patient's [place] prior to being released. Also, the skilled facility will be responsible for providing the patient with their medications at time of release from the facility to include their pain medication, the muscle relaxants, and their blood thinner medication. If the patient is still at the rehab facility at time of the two week follow up appointment, the skilled rehab facility will also need to assist the patient in arranging follow up appointment in our office and any transportation needs.  MAKE SURE YOU:   Understand these instructions.   Get help right away if you are not doing well or get worse.    Pick up stool softner and laxative for home use following surgery while on pain medications. Do not submerge incision under water. Please use good hand washing techniques while changing dressing each day. May shower starting three days after surgery. Please use a clean towel to pat the incision dry following showers. Continue to use ice for pain and swelling after surgery. Do not use any lotions or creams on the incision until instructed by your surgeon.

## 2018-12-14 NOTE — Interval H&P Note (Signed)
History and Physical Interval Note:  12/14/2018 7:46 AM  Kirk Price  has presented today for surgery, with the diagnosis of left hip osteoarthritis.  The various methods of treatment have been discussed with the patient and family. After consideration of risks, benefits and other options for treatment, the patient has consented to  Procedure(s) with comments: [place] (Left) - 134min as a surgical intervention.  The patient's history has been reviewed, patient examined, no change in status, stable for surgery.  I have reviewed the patient's chart and labs.  Questions were answered to the patient's satisfaction.     [name]

## 2018-12-14 NOTE — Anesthesia Preprocedure Evaluation (Signed)
Anesthesia Evaluation  Patient identified by MRN, date of birth, ID band Patient awake    Reviewed: Allergy & Precautions, NPO status , Patient's Chart, lab work & pertinent test results  Airway Mallampati: II  TM Distance: >3 FB Neck ROM: Full    Dental   Pulmonary neg pulmonary ROS,    breath sounds clear to auscultation       Cardiovascular negative cardio ROS   Rhythm:Regular Rate:Normal     Neuro/Psych negative neurological ROS     GI/Hepatic negative GI ROS, Neg liver ROS,   Endo/Other  negative endocrine ROS  Renal/GU negative Renal ROS     Musculoskeletal  (+) Arthritis ,   Abdominal   Peds  Hematology negative hematology ROS (+)   Anesthesia Other Findings   Reproductive/Obstetrics                             Lab Results  Component Value Date   WBC 6.9 12/13/2018   HGB 16.1 12/13/2018   HCT 50.7 12/13/2018   MCV 97.1 12/13/2018   PLT 252 12/13/2018   Lab Results  Component Value Date   CREATININE 0.96 12/13/2018   BUN 20 12/13/2018   NA 142 12/13/2018   K 4.5 12/13/2018   CL 107 12/13/2018   CO2 29 12/13/2018    Anesthesia Physical Anesthesia Plan  ASA: I  Anesthesia Plan: Spinal   Post-op Pain Management:    Induction:   PONV Risk Score and Plan: 1 and Propofol infusion, Ondansetron and Treatment may vary due to age or medical condition  Airway Management Planned: Natural Airway and Simple Face Mask  Additional Equipment:   Intra-op Plan:   Post-operative Plan:   Informed Consent: I have reviewed the patients History and Physical, chart, labs and discussed the procedure including the risks, benefits and alternatives for the proposed anesthesia with the patient or authorized representative who has indicated his/her understanding and acceptance.       Plan Discussed with: CRNA  Anesthesia Plan Comments:         Anesthesia Quick  Evaluation

## 2018-12-14 NOTE — Evaluation (Signed)
Physical Therapy Evaluation Patient Details Name: Kirk Price MRN: [id number] DOB: May 24, 1950 Today's Date: 12/14/2018   History of Present Illness  Patient is 68 y.o male s/p Lt THA direct anterior approach. PMH includes hepatitis, arthritis, basal cell skin cancer, and pt underwent Rt THA in 2016.    Clinical Impression  Cesareo Vickrey is a 68 y.o. male POD 0 s/p Lt THA anterior approach. He reports independence at baseline and is currently limited by functional impairments listed below (see PT Problem List). Patient required min assist for bed mobility today and min guard with RW for transfers and gait. He demonstrated safe use of RW for mobility and gait distance was limited secondary to nausea. He will continue to benefit from skilled PT at below venue to progress mobility and independence. Acute PT will follow and progress mobility as able as well as provide stair training for safe return home.    Follow Up Recommendations Home health PT    Equipment Recommendations  None recommended by PT    Recommendations for Other Services       Precautions / Restrictions Precautions Precautions: None Restrictions Weight Bearing Restrictions: No LLE Weight Bearing: Weight bearing as tolerated Other Position/Activity Restrictions: WBAT LT LE      Mobility  Bed Mobility Overal bed mobility: Needs Assistance Bed Mobility: Supine to Sit;Sit to Supine     Supine to sit: Min assist Sit to supine: Min assist   General bed mobility comments: pt rquried min assist for Lt LE management secondary to pain/difficulty raising LE off bed, pt able to sequence transfer in bed to sit up using bed rails  Transfers Overall transfer level: Needs assistance Equipment used: Rolling walker (2 wheeled) Transfers: Sit to/from Stand Sit to Stand: Min guard         General transfer comment: patient demonstrated good sequencing with sit to stand transfer, no cues  needed  Ambulation/Gait Ambulation/Gait assistance: Min guard Gait Distance (Feet): 100 Feet Assistive device: Rolling walker (2 wheeled) Gait Pattern/deviations: Step-through pattern;Decreased step length - left;Decreased stride length;Decreased step length - right;Decreased stance time - left;Antalgic Gait velocity: WNL's   General Gait Details: pt with some antalgia, pt with safe hand placement on RW and no cues needed, limited distance secondary to weakness and nausea  Stairs            Wheelchair Mobility    Modified Rankin (Stroke Patients Only)       Balance Overall balance assessment: Needs assistance Sitting-balance support: No upper extremity supported;Feet supported Sitting balance-Leahy Scale: Good       Standing balance-Leahy Scale: Fair Standing balance comment: pt able to tolerate static standing without support but requires UE support for dynamic standing and gait            Pertinent Vitals/Pain Pain Assessment: Faces Faces Pain Scale: Hurts a little bit Pain Location: Lt hip Pain Descriptors / Indicators: Dull;Aching Pain Intervention(s): Limited activity within patient's tolerance;Monitored during session;Ice applied    Home Living Family/patient expects to be discharged to:: Private residence Living Arrangements: Spouse/significant other Available Help at Discharge: Family Type of Home: House Home Access: Stairs to enter Entrance Stairs-Rails: None Entrance Stairs-Number of Steps: 2 Home Layout: Two level Home Equipment: Environmental consultant - 2 wheels;Cane - single point;Bedside commode;Shower seat - built in;Hand held shower head      Prior Function Level of Independence: Independent          Hand Dominance        Extremity/Trunk  Assessment   Upper Extremity Assessment Upper Extremity Assessment: Overall WFL for tasks assessed    Lower Extremity Assessment Lower Extremity Assessment: LLE deficits/detail LLE Deficits / Details: pt  unable to perform hip flexion due to pain, knee extension and ankle dorsiflexion are 4+/5    Cervical / Trunk Assessment Cervical / Trunk Assessment: Normal  Communication   Communication: No difficulties  Cognition Arousal/Alertness: Awake/alert Behavior During Therapy: WFL for tasks assessed/performed Overall Cognitive Status: Within Functional Limits for tasks assessed                          Assessment/Plan    PT Assessment Patient needs continued PT services  PT Problem List Decreased strength;Decreased mobility;Decreased activity tolerance;Decreased balance;Pain       PT Treatment Interventions Functional mobility training;DME instruction;Balance training;Patient/family education;Modalities;Gait training;Therapeutic activities;Neuromuscular re-education;Stair training;Therapeutic exercise    PT Goals (Current goals can be found in the Care Plan section)  Acute Rehab PT Goals Patient Stated Goal: to return home and progress from the walker PT Goal Formulation: With patient Time For Goal Achievement: 12/18/18 Potential to Achieve Goals: Good    Frequency 7X/week    AM-PAC PT "6 Clicks" Mobility  Outcome Measure Help needed turning from your back to your side while in a flat bed without using bedrails?: A Little Help needed moving from lying on your back to sitting on the side of a flat bed without using bedrails?: A Little Help needed moving to and from a bed to a chair (including a wheelchair)?: A Little Help needed standing up from a chair using your arms (e.g., wheelchair or bedside chair)?: A Little Help needed to walk in hospital room?: A Little Help needed climbing 3-5 steps with a railing? : A Little 6 Click Score: 18    End of Session Equipment Utilized During Treatment: Gait belt Activity Tolerance: Patient tolerated treatment well Patient left: in bed;with call bell/phone within reach;with nursing/sitter in room Nurse Communication: Mobility  status PT Visit Diagnosis: Muscle weakness (generalized) (M62.81);Difficulty in walking, not elsewhere classified (R26.2);Pain Pain - Right/Left: Left Pain - part of body: Hip    Time: 1324-1350 PT Time Calculation (min) (ACUTE ONLY): 26 min   Charges:   PT Evaluation $PT Eval Low Complexity: 1 Low         Kipp Brood, PT, DPT, [facility] Physical Therapist with [facility]  12/14/2018 4:30 PM

## 2018-12-14 NOTE — Transfer of Care (Signed)
Immediate Anesthesia Transfer of Care Note  Patient: Kirk Price  Procedure(s) Performed: TOTAL HIP ARTHROPLASTY ANTERIOR APPROACH (Left )  Patient Location: PACU  Anesthesia Type:Spinal  Level of Consciousness: drowsy  Airway & Oxygen Therapy: Patient Spontanous Breathing and Patient connected to face mask oxygen  Post-op Assessment: Report given to RN and Post -op Vital signs reviewed and stable  Post vital signs: Reviewed and stable  Last Vitals:  Vitals Value Taken Time  BP 105/72 12/14/18 0958  Temp    Pulse 65 12/14/18 0959  Resp 15 12/14/18 0959  SpO2 100 % 12/14/18 0959  Vitals shown include unvalidated device data.  Last Pain:  Vitals:   12/14/18 0630  TempSrc: Oral  PainSc:       Patients Stated Pain Goal: 4 (07/86/75 4492)  Complications: No apparent anesthesia complications

## 2018-12-14 NOTE — Anesthesia Procedure Notes (Signed)
Spinal  Patient location during procedure: OR Start time: 12/14/2018 8:26 AM End time: 12/14/2018 8:30 AM Staffing Resident/CRNA: Sharlette Dense, CRNA Performed: resident/CRNA  Preanesthetic Checklist Completed: patient identified, site marked, surgical consent, pre-op evaluation, timeout performed, IV checked, risks and benefits discussed and monitors and equipment checked Spinal Block Patient position: sitting Prep: site prepped and draped and DuraPrep Patient monitoring: heart rate, continuous pulse ox and blood pressure Approach: midline Location: L4-5 Injection technique: single-shot Needle Needle type: Sprotte  Needle gauge: 24 G Needle length: 9 cm Additional Notes Kit expiration date 03/04/2019 and lot #[id number] Clear free flow CSF, negative heme, negative paresthesia Tolerated well and returned to supine position

## 2018-12-15 DIAGNOSIS — Z8719 Personal history of other diseases of the digestive system: Secondary | ICD-10-CM | POA: Diagnosis not present

## 2018-12-15 DIAGNOSIS — Z85828 Personal history of other malignant neoplasm of skin: Secondary | ICD-10-CM | POA: Diagnosis not present

## 2018-12-15 DIAGNOSIS — M1612 Unilateral primary osteoarthritis, left hip: Secondary | ICD-10-CM | POA: Diagnosis not present

## 2018-12-15 DIAGNOSIS — Z96641 Presence of right artificial hip joint: Secondary | ICD-10-CM | POA: Diagnosis not present

## 2018-12-15 DIAGNOSIS — Z7982 Long term (current) use of aspirin: Secondary | ICD-10-CM | POA: Diagnosis not present

## 2018-12-15 DIAGNOSIS — Z7901 Long term (current) use of anticoagulants: Secondary | ICD-10-CM | POA: Diagnosis not present

## 2018-12-15 LAB — BASIC METABOLIC PANEL
Anion gap: 8 (ref 5–15)
BUN: 18 mg/dL (ref 8–23)
CO2: 25 mmol/L (ref 22–32)
Calcium: 8.6 mg/dL — ABNORMAL LOW (ref 8.9–10.3)
Chloride: 104 mmol/L (ref 98–111)
Creatinine, Ser: 0.98 mg/dL (ref 0.61–1.24)
GFR calc Af Amer: >60 mL/min (ref >60)
GFR calc non Af Amer: >60 mL/min (ref >60)
Glucose, Bld: 144 mg/dL — ABNORMAL HIGH (ref 70–99)
Potassium: 4.4 mmol/L (ref 3.5–5.1)
Sodium: 137 mmol/L (ref 135–145)

## 2018-12-15 LAB — CBC
HCT: 44 % (ref 39.0–52.0)
Hemoglobin: 14.4 g/dL (ref 13.0–17.0)
MCH: 31.6 pg (ref 26.0–34.0)
MCHC: 32.7 g/dL (ref 30.0–36.0)
MCV: 96.5 fL (ref 80.0–100.0)
Platelets: 219 10*3/uL (ref 150–400)
RBC: 4.56 MIL/uL (ref 4.22–5.81)
RDW: 12.6 % (ref 11.5–15.5)
WBC: 17.4 10*3/uL — ABNORMAL HIGH (ref 4.0–10.5)
nRBC: 0 % (ref 0.0–0.2)

## 2018-12-15 MED ORDER — METHOCARBAMOL 500 MG PO TABS: 500.0000 mg | ORAL_TABLET | Freq: Four times a day (QID) | ORAL | 0 refills | Status: AC | PRN

## 2018-12-15 MED ORDER — ASPIRIN 325 MG PO TBEC: 325.0000 mg | DELAYED_RELEASE_TABLET | Freq: Two times a day (BID) | ORAL | 0 refills | Status: AC

## 2018-12-15 MED ORDER — TRAMADOL HCL 50 MG PO TABS: 50.0000 mg | ORAL_TABLET | Freq: Four times a day (QID) | ORAL | 0 refills | Status: AC | PRN

## 2018-12-15 MED ORDER — HYDROCODONE-ACETAMINOPHEN 5-325 MG PO TABS: 1.0000 | ORAL_TABLET | Freq: Four times a day (QID) | ORAL | 0 refills | Status: AC | PRN

## 2018-12-15 NOTE — Care Management CC44 (Signed)
Condition Code 44 Documentation Completed  Patient Details  Name: Kirk Price MRN: [id number] Date of Birth: 1951/03/20   Condition Code 44 given:  Yes Patient signature on Condition Code 44 notice:  Yes Documentation of 2 MD's agreement:  Yes Code 44 added to claim:  Yes    Leeroy Cha, RN 12/15/2018, 9:42 AM

## 2018-12-15 NOTE — Progress Notes (Signed)
   Subjective: 1 Day Post-Op Procedure(s) (LRB): TOTAL HIP ARTHROPLASTY ANTERIOR APPROACH (Left) Patient reports pain as mild.   Patient seen in rounds with Dr. Wynelle Link. Patient is well, and has had no acute complaints or problems. States the left hip is feeling well. Denies chest pain or SOB. Foley catheter to be removed this AM. No issues overnight.  We will continue therapy today.   Objective: Vital signs in last 24 hours: Temp:  [97.4 F (36.3 C)-98.9 F (37.2 C)] 98.1 F (36.7 C) (08/13 0529) Pulse Rate:  [46-66] 54 (08/13 0529) Resp:  [10-17] 17 (08/13 0529) BP: (102-142)/(63-81) 104/68 (08/13 0529) SpO2:  [97 %-100 %] 97 % (08/13 0529)  Intake/Output from previous day:  Intake/Output Summary (Last 24 hours) at 12/15/2018 0736 Last data filed at 12/15/2018 0600 Gross per 24 hour  Intake 3784.79 ml  Output 1590 ml  Net 2194.79 ml     Intake/Output this shift: No intake/output data recorded.  Labs: Recent Labs    12/13/18 1028 12/15/18 0310  HGB 16.1 14.4   Recent Labs    12/13/18 1028 12/15/18 0310  WBC 6.9 17.4*  RBC 5.22 4.56  HCT 50.7 44.0  PLT 252 219   Recent Labs    12/13/18 1028 12/15/18 0310  NA 142 137  K 4.5 4.4  CL 107 104  CO2 29 25  BUN 20 18  CREATININE 0.96 0.98  GLUCOSE 112* 144*  CALCIUM 9.5 8.6*   Recent Labs    12/13/18 1028  INR 0.9    Exam: General - Patient is Alert and Oriented Extremity - Neurologically intact Neurovascular intact Sensation intact distally Dorsiflexion/Plantar flexion intact Dressing - dressing C/D/I Motor Function - intact, moving foot and toes well on exam.   Past Medical History:  Diagnosis Date  . Arthritis   . Cancer ([place])    basal cells removed from face  . Difficulty in urination    after anesthesia   . Hepatitis    patient denies this diagnosis, progress note from PCP Dr Drema Dallas does not inlude hepatitis in [place] list     Assessment/Plan: 1 Day Post-Op Procedure(s) (LRB): TOTAL  HIP ARTHROPLASTY ANTERIOR APPROACH (Left) Principal Problem:   OA (osteoarthritis) of hip  Estimated body mass index is 27.41 kg/m as calculated from the following:   Height as of this encounter: 5\' 10"  (1.778 m).   Weight as of this encounter: 86.6 kg. Advance diet Up with therapy D/C IV fluids  DVT Prophylaxis - Aspirin Weight bearing as tolerated. D/C O2 and pulse ox and try on room air. Hemovac pulled without difficulty, will continue therapy.  Plan is to go Home after hospital stay. Plan for discharge with HEP once progresses with therapy and meeting his goals. Follow-up in the office in 2 weeks.   Theresa Duty, PA-C Orthopedic Surgery 12/15/2018, 7:36 AM

## 2018-12-15 NOTE — Plan of Care (Signed)
Pt ready for DC home 

## 2018-12-15 NOTE — TOC Transition Note (Signed)
Transition of Care [facility]) - CM/SW Discharge Note   Patient Details  Name: Kirk Price MRN: [id number] Date of Birth: 1951/03/13  [facility] CM/SW Contact:  Leeroy Cha, RN Phone Number: 12/15/2018, 9:58 AM   Clinical Narrative:    Discharge to home with HEP.  States that he has needed equip 3 in 1 and a rolling walker at home.   Final next level of care: Home/Self Care Barriers to Discharge: No Barriers Identified   Patient Goals and CMS Choice Patient states their goals for this hospitalization and ongoing recovery are:: to go home and do the hep CMS Medicare.gov Compare Post Acute Care list provided to:: Patient    Discharge Placement                       Discharge Plan and Services   Discharge Planning Services: CM Consult                                 Social Determinants of Health (SDOH) Interventions     Readmission Risk Interventions No flowsheet data found.

## 2018-12-15 NOTE — Care Management Obs Status (Signed)
[facility] NOTIFICATION   Patient Details  Name: Kirk Price MRN: [id number] Date of Birth: 02/01/1951   Medicare Observation Status Notification Given:  Yes    Leeroy Cha, RN 12/15/2018, 9:42 AM

## 2018-12-15 NOTE — Progress Notes (Signed)
Physical Therapy Treatment Patient Details Name: Kirk Price MRN: [id number] DOB: 1950-11-08 Today's Date: 12/15/2018    History of Present Illness Patient is 68 y.o male s/p Lt THA direct anterior approach. PMH includes hepatitis, arthritis, basal cell skin cancer, and pt underwent Rt THA in 2016.    PT Comments    Pt ambulated 300' with RW, completed stair training, and demonstrates good understanding of HEP. He is ready to DC home from PT standpoint.   Follow Up Recommendations  Home health PT     Equipment Recommendations  None recommended by PT    Recommendations for Other Services       Precautions / Restrictions Precautions Precautions: None Restrictions Weight Bearing Restrictions: No LLE Weight Bearing: Weight bearing as tolerated Other Position/Activity Restrictions: WBAT LT LE    Mobility  Bed Mobility Overal bed mobility: Modified Independent Bed Mobility: Supine to Sit     Supine to sit: Modified independent (Device/Increase time);HOB elevated     General bed mobility comments: used bedrail  Transfers Overall transfer level: Modified independent Equipment used: Rolling walker (2 wheeled) Transfers: Sit to/from Stand Sit to Stand: Modified independent (Device/Increase time)         General transfer comment: good hand placement  Ambulation/Gait Ambulation/Gait assistance: Modified independent (Device/Increase time) Gait Distance (Feet): 300 Feet Assistive device: Rolling walker (2 wheeled) Gait Pattern/deviations: Step-through pattern;Decreased stride length;Decreased stance time - left Gait velocity: WNL's   General Gait Details: good sequencing, no LOB   Stairs Stairs: Yes Stairs assistance: Min assist Stair Management: No rails;Backwards;Step to pattern Number of Stairs: 3 General stair comments: 3 stairs x 3 trials, min A to manage RW, VCs sequencing   Wheelchair Mobility    Modified Rankin (Stroke Patients Only)        Balance Overall balance assessment: Needs assistance Sitting-balance support: No upper extremity supported;Feet supported Sitting balance-Leahy Scale: Good       Standing balance-Leahy Scale: Fair Standing balance comment: pt able to tolerate static standing without support but requires UE support for dynamic standing and gait                            Cognition Arousal/Alertness: Awake/alert Behavior During Therapy: WFL for tasks assessed/performed Overall Cognitive Status: Within Functional Limits for tasks assessed                                        Exercises Total Joint Exercises Ankle Circles/Pumps: AROM;Both;10 reps;Supine Quad Sets: AROM;Left;5 reps;Supine Short Arc Quad: AROM;Left;10 reps;Supine Heel Slides: AAROM;Left;10 reps;Supine Hip ABduction/ADduction: AAROM;Left;10 reps;Supine;Standing(10 supine, 10 standing) Long Arc Quad: AROM;Left;10 reps;Seated Knee Flexion: AROM;Left;10 reps;Standing Marching in Standing: AROM;Left;10 reps;Standing Standing Hip Extension: AROM;Left;10 reps;Standing    General Comments        Pertinent Vitals/Pain Pain Score: 5  Pain Location: L hip with walking Pain Descriptors / Indicators: Sore Pain Intervention(s): Limited activity within patient's tolerance;Monitored during session;Premedicated before session;Ice applied    Home Living                      Prior Function            PT Goals (current goals can now be found in the care plan section) Acute Rehab PT Goals Patient Stated Goal: swim, play tennis PT Goal Formulation: With patient Time For Goal Achievement: 12/18/18  Potential to Achieve Goals: Good Progress towards PT goals: Progressing toward goals    Frequency    7X/week      PT Plan Current plan remains appropriate    Co-evaluation              AM-PAC PT "6 Clicks" Mobility   Outcome Measure  Help needed turning from your back to your side  while in a flat bed without using bedrails?: None Help needed moving from lying on your back to sitting on the side of a flat bed without using bedrails?: None Help needed moving to and from a bed to a chair (including a wheelchair)?: None Help needed standing up from a chair using your arms (e.g., wheelchair or bedside chair)?: None Help needed to walk in hospital room?: None Help needed climbing 3-5 steps with a railing? : A Little 6 Click Score: 23    End of Session Equipment Utilized During Treatment: Gait belt Activity Tolerance: Patient tolerated treatment well Patient left: with call bell/phone within reach;in chair Nurse Communication: Mobility status PT Visit Diagnosis: Muscle weakness (generalized) (M62.81);Difficulty in walking, not elsewhere classified (R26.2);Pain Pain - Right/Left: Left Pain - part of body: Hip     Time: 2902-1115 PT Time Calculation (min) (ACUTE ONLY): 23 min  Charges:  $Gait Training: 8-22 mins $Therapeutic Exercise: 8-22 mins                    Blondell Reveal Kistler PT 12/15/2018  Acute Rehabilitation Services Pager [phone] Office [phone]

## 2018-12-16 ENCOUNTER — Encounter (HOSPITAL_COMMUNITY): Payer: Self-pay | Admitting: Orthopedic Surgery

## 2018-12-19 NOTE — Discharge Summary (Signed)
Physician Discharge Summary   Patient ID: Kirk Price MRN: [id number] DOB/AGE: 69-Aug-1952 69 y.o.  Admit date: 12/14/2018 Discharge date: 12/15/2018  Primary Diagnosis: Osteoarthritis, left hip   Admission Diagnoses:  Past Medical History:  Diagnosis Date  . Arthritis   . Cancer ([place])    basal cells removed from face  . Difficulty in urination    after anesthesia   . Hepatitis    patient denies this diagnosis, progress note from PCP Dr Drema [place] does not inlude hepatitis in [facility] list    Discharge Diagnoses:   Principal Problem:   OA (osteoarthritis) of hip  Estimated body mass index is 27.41 kg/m as calculated from the following:   Height as of this encounter: 5\' 10"  (1.778 m).   Weight as of this encounter: 86.6 kg.  Procedure:  Procedure(s) (LRB): TOTAL HIP ARTHROPLASTY ANTERIOR APPROACH (Left)   Consults: None  HPI: Kirk Price is a 68 y.o. male who has advanced end-stage arthritis of their Left  hip with progressively worsening pain and dysfunction.The patient has failed nonoperative management and presents for total hip arthroplasty.   Laboratory Data: Admission on 12/14/2018, Discharged on 12/15/2018  Component Date Value Ref Range Status  . WBC 12/15/2018 17.4* 4.0 - 10.5 K/uL Final  . RBC 12/15/2018 4.56  4.22 - 5.81 MIL/uL Final  . Hemoglobin 12/15/2018 14.4  13.0 - 17.0 g/dL Final  . HCT 12/15/2018 44.0  39.0 - 52.0 % Final  . MCV 12/15/2018 96.5  80.0 - 100.0 fL Final  . MCH 12/15/2018 31.6  26.0 - 34.0 pg Final  . MCHC 12/15/2018 32.7  30.0 - 36.0 g/dL Final  . RDW 12/15/2018 12.6  11.5 - 15.5 % Final  . Platelets 12/15/2018 219  150 - 400 K/uL Final  . nRBC 12/15/2018 0.0  0.0 - 0.2 % Final   Performed at [facility], [place] [address] [place], [place] [address]  . Sodium 12/15/2018 137  135 - 145 mmol/L Final  . Potassium 12/15/2018 4.4  3.5 - 5.1 mmol/L Final  . Chloride 12/15/2018 104  98 - 111 mmol/L Final  . CO2  12/15/2018 25  22 - 32 mmol/L Final  . Glucose, Bld 12/15/2018 144* 70 - 99 mg/dL Final  . BUN 12/15/2018 18  8 - 23 mg/dL Final  . Creatinine, Ser 12/15/2018 0.98  0.61 - 1.24 mg/dL Final  . Calcium 12/15/2018 8.6* 8.9 - 10.3 mg/dL Final  . GFR calc non Af Amer 12/15/2018 >60  >60 mL/min Final  . GFR calc Af Amer 12/15/2018 >60  >60 mL/min Final  . Anion gap 12/15/2018 8  5 - 15 Final   Performed at [facility], [place] [address] [place], [place] [address]  Hospital Outpatient Visit on 12/13/2018  Component Date Value Ref Range Status  . aPTT 12/13/2018 55* 24 - 36 seconds Final   Comment:        IF BASELINE aPTT IS ELEVATED, SUGGEST PATIENT RISK ASSESSMENT BE USED TO DETERMINE APPROPRIATE ANTICOAGULANT THERAPY. Performed at [facility], [place] [address], [place],  [address]   . WBC 12/13/2018 6.9  4.0 - 10.5 K/uL Final  . RBC 12/13/2018 5.22  4.22 - 5.81 MIL/uL Final  . Hemoglobin 12/13/2018 16.1  13.0 - 17.0 g/dL Final  . HCT 12/13/2018 50.7  39.0 - 52.0 % Final  . MCV 12/13/2018 97.1  80.0 - 100.0 fL Final  . MCH 12/13/2018 30.8  26.0 - 34.0 pg Final  .  MCHC 12/13/2018 31.8  30.0 - 36.0 g/dL Final  . RDW 12/13/2018 12.6  11.5 - 15.5 % Final  . Platelets 12/13/2018 252  150 - 400 K/uL Final  . nRBC 12/13/2018 0.0  0.0 - 0.2 % Final   Performed at [facility], [place] [address], [place], [place] [address]  . Sodium 12/13/2018 142  135 - 145 mmol/L Final  . Potassium 12/13/2018 4.5  3.5 - 5.1 mmol/L Final  . Chloride 12/13/2018 107  98 - 111 mmol/L Final  . CO2 12/13/2018 29  22 - 32 mmol/L Final  . Glucose, Bld 12/13/2018 112* 70 - 99 mg/dL Final  . BUN 12/13/2018 20  8 - 23 mg/dL Final  . Creatinine, Ser 12/13/2018 0.96  0.61 - 1.24 mg/dL Final  . Calcium 12/13/2018 9.5  8.9 - 10.3 mg/dL Final  . Total Protein 12/13/2018 7.2  6.5 - 8.1 g/dL Final  . Albumin 12/13/2018 4.2  3.5 - 5.0 g/dL Final  . AST 12/13/2018 18   15 - 41 U/L Final  . ALT 12/13/2018 20  0 - 44 U/L Final  . Alkaline Phosphatase 12/13/2018 93  38 - 126 U/L Final  . Total Bilirubin 12/13/2018 0.9  0.3 - 1.2 mg/dL Final  . GFR calc non Af Amer 12/13/2018 >60  >60 mL/min Final  . GFR calc Af Amer 12/13/2018 >60  >60 mL/min Final  . Anion gap 12/13/2018 6  5 - 15 Final   Performed at [facility], [place] [address], [place], [place] [address]  . Prothrombin Time 12/13/2018 12.4  11.4 - 15.2 seconds Final  . INR 12/13/2018 0.9  0.8 - 1.2 Final   Comment: (NOTE) INR goal varies based on device and disease states. Performed at [facility], [place] [address], [place], [place] [address]   . ABO/RH(D) 12/13/2018 O POS   Final  . Antibody Screen 12/13/2018 NEG   Final  . Sample Expiration 12/13/2018 12/17/2018,2359   Final  . Extend sample reason 12/13/2018    Final                   Value:NO TRANSFUSIONS OR PREGNANCY IN THE PAST 3 MONTHS Performed at [facility], [place] [address] [place], [place] [address]   . MRSA, PCR 12/13/2018 NEGATIVE  NEGATIVE Final  . Staphylococcus aureus 12/13/2018 NEGATIVE  NEGATIVE Final   Comment: (NOTE) The Xpert SA Assay (FDA approved for NASAL specimens in patients 71 years of age and older), is one component of a comprehensive surveillance program. It is not intended to diagnose infection nor to guide or monitor treatment. Performed at [facility], [place] [address] [place], [place] [address]   . ABO/RH(D) 12/13/2018    Final                   Value:O POS Performed at [facility], [place] [address] [place], [place] [address]   Hospital Outpatient Visit on 12/10/2018  Component Date Value Ref Range Status  . SARS Coronavirus 2 12/10/2018 NEGATIVE  NEGATIVE Final   Comment: (NOTE) SARS-CoV-2 target nucleic acids are NOT DETECTED. The SARS-CoV-2 RNA is generally detectable in upper and lower respiratory specimens  during the acute phase of infection. Negative results do not preclude SARS-CoV-2 infection, do not rule out co-infections with other pathogens, and should not be used as the sole basis for treatment or other patient management decisions. Negative results must be combined with  clinical observations, patient history, and epidemiological information. The expected result is Negative. Fact Sheet for Patients: SugarRoll.be Fact Sheet for Healthcare Providers: https://www.woods-mathews.com/ This test is not yet approved or cleared by the [place] FDA and  has been authorized for detection and/or diagnosis of SARS-CoV-2 by FDA under an Emergency Use Authorization (EUA). This EUA will remain  in effect (meaning this test can be used) for the duration of the COVID-19 declaration under Section 56                          4(b)(1) of the Act, 21 U.S.C. section 360bbb-3(b)(1), unless the authorization is terminated or revoked sooner. Performed at [facility], [place] [address], [place], [place] [address]      X-Rays:Dg Pelvis Portable  Result Date: 12/14/2018 CLINICAL DATA:  Status post total hip arthroplasty on the left EXAM: PORTABLE PELVIS 1-2 VIEWS COMPARISON:  Sep 28, 2014 FINDINGS: There are expected postsurgical changes related to total hip arthroplasty on the left. Those changes include overlying subcutaneous gas and soft tissue edema. A surgical drain is noted. The osseous alignment appears near anatomic. The patient is status post remote total hip arthroplasty on the left. Phleboliths project over the patient's pelvis. IMPRESSION: Expected postsurgical changes related to total hip arthroplasty on the left. Electronically Signed   By: Constance Holster M.D.   On: 12/14/2018 12:06   Dg C-arm 1-60 Min-no Report  Result Date: 12/14/2018 Fluoroscopy was utilized by the requesting physician.  No radiographic interpretation.   Dg Hip Operative  Unilat W Or W/o Pelvis Left  Result Date: 12/14/2018 CLINICAL DATA:  Left hip arthroplasty EXAM: OPERATIVE LEFT HIP (WITH PELVIS IF PERFORMED) 2 VIEWS TECHNIQUE: Fluoroscopic spot image(s) were submitted for interpretation post-operatively. COMPARISON:  Sep 28, 2014. FINDINGS: The patient has undergone total hip arthroplasty on the left. The alignment appears near anatomic. A total of 2 images were saved. The total fluoroscopy time was 18 seconds. IMPRESSION: Status post total hip arthroplasty on the left. Electronically Signed   By: Constance Holster M.D.   On: 12/14/2018 12:05    EKG: Orders placed or performed during the hospital encounter of 09/17/14  . EKG 12-Lead  . EKG 12-Lead     Hospital Course: Thadeus Gandolfi is a 68 y.o. who was admitted to [facility]. They were brought to the operating room on 12/14/2018 and underwent Procedure(s): [place].  Patient tolerated the procedure well and was later transferred to the recovery room and then to the orthopaedic floor for postoperative care. They were given PO and IV analgesics for pain control following their surgery. They were given 24 hours of postoperative antibiotics of  Anti-infectives (From admission, onward)   Start     Dose/Rate Route Frequency Ordered Stop   12/14/18 1430  ceFAZolin (ANCEF) IVPB 2g/100 mL premix     2 g 200 mL/hr over 30 Minutes Intravenous Every 6 hours 12/14/18 1125 12/14/18 2036   12/14/18 0615  ceFAZolin (ANCEF) IVPB 2g/100 mL premix     2 g 200 mL/hr over 30 Minutes Intravenous On call to O.R. 12/14/18 2637 12/14/18 0842     and started on DVT prophylaxis in the form of Aspirin.   PT and OT were ordered for total joint protocol. Discharge planning consulted to help with postop disposition and equipment needs.  Patient had a good night on the evening of surgery. They started to get up OOB  with therapy on POD #0. Pt was seen during rounds and was ready to go home  pending progress with therapy. Hemovac drain was pulled without difficulty. He worked with therapy on POD #1 and was meeting his goals. Pt was discharged to home later that day in stable condition.  Diet: Regular diet Activity: WBAT Follow-up: in 2 weeks Disposition: Home with HEP Discharged Condition: stable   Discharge Instructions    Call MD / Call 911   Complete by: As directed    If you experience chest pain or shortness of breath, CALL 911 and be transported to the hospital emergency room.  If you develope a fever above 101 F, pus (white drainage) or increased drainage or redness at the wound, or calf pain, call your surgeon's office.   Change dressing   Complete by: As directed    You may change your dressing on Friday, then change the dressing daily with sterile 4 x 4 inch gauze dressing and paper tape.   Constipation Prevention   Complete by: As directed    Drink plenty of fluids.  Prune juice may be helpful.  You may use a stool softener, such as Colace (over the counter) 100 mg twice a day.  Use MiraLax (over the counter) for constipation as needed.   Diet - low sodium heart healthy   Complete by: As directed    Discharge instructions   Complete by: As directed    Dr. Gaynelle Arabian Total Joint Specialist [facility] [address], [place], [place] [address] [phone]  ANTERIOR APPROACH TOTAL HIP REPLACEMENT POSTOPERATIVE DIRECTIONS   Hip Rehabilitation, Guidelines Following Surgery  The results of a hip operation are greatly improved after range of motion and muscle strengthening exercises. Follow all safety measures which are given to protect your hip. If any of these exercises cause increased pain or swelling in your joint, decrease the amount until you are comfortable again. Then slowly increase the exercises. Call your caregiver if you have problems or questions.   HOME CARE INSTRUCTIONS  Remove items at home which could result in a fall. This  includes throw rugs or furniture in walking pathways.  ICE to the affected hip every three hours for 30 minutes at a time and then as needed for pain and swelling.  Continue to use ice on the hip for pain and swelling from surgery. You may notice swelling that will progress down to the foot and ankle.  This is normal after surgery.  Elevate the leg when you are not up walking on it.   Continue to use the breathing machine which will help keep your temperature down.  It is common for your temperature to cycle up and down following surgery, especially at night when you are not up moving around and exerting yourself.  The breathing machine keeps your lungs expanded and your temperature down.  DIET You may resume your previous home diet once your are discharged from the hospital.  DRESSING / WOUND CARE / SHOWERING You may change your dressing 3-5 days after surgery.  Then change the dressing every day with sterile gauze.  Please use good hand washing techniques before changing the dressing.  Do not use any lotions or creams on the incision until instructed by your surgeon. You may start showering once you are discharged home but do not submerge the incision under water. Just pat the incision dry and apply a dry gauze dressing on daily. Change the surgical dressing daily and reapply  a dry dressing each time.  ACTIVITY Walk with your walker as instructed. Use walker as long as suggested by your caregivers. Avoid periods of inactivity such as sitting longer than an hour when not asleep. This helps prevent blood clots.  You may resume a sexual relationship in one month or when given the OK by your doctor.  You may return to work once you are cleared by your doctor.  Do not drive a car for 6 weeks or until released by you surgeon.  Do not drive while taking narcotics.  WEIGHT BEARING Weight bearing as tolerated with assist device (walker, cane, etc) as directed, use it as long as suggested by your  surgeon or therapist, typically at least 4-6 weeks.  POSTOPERATIVE CONSTIPATION PROTOCOL Constipation - defined medically as fewer than three stools per week and severe constipation as less than one stool per week.  One of the most common issues patients have following surgery is constipation.  Even if you have a regular bowel pattern at home, your normal regimen is likely to be disrupted due to multiple reasons following surgery.  Combination of anesthesia, postoperative narcotics, change in appetite and fluid intake all can affect your bowels.  In order to avoid complications following surgery, here are some recommendations in order to help you during your recovery period.  Colace (docusate) - Pick up an over-the-counter form of Colace or another stool softener and take twice a day as long as you are requiring postoperative pain medications.  Take with a full glass of water daily.  If you experience loose stools or diarrhea, hold the colace until you stool forms back up.  If your symptoms do not get better within 1 week or if they get worse, check with your doctor.  Dulcolax (bisacodyl) - Pick up over-the-counter and take as directed by the product packaging as needed to assist with the movement of your bowels.  Take with a full glass of water.  Use this product as needed if not relieved by Colace only.   MiraLax (polyethylene glycol) - Pick up over-the-counter to have on hand.  MiraLax is a solution that will increase the amount of water in your bowels to assist with bowel movements.  Take as directed and can mix with a glass of water, juice, soda, coffee, or tea.  Take if you go more than two days without a movement. Do not use MiraLax more than once per day. Call your doctor if you are still constipated or irregular after using this medication for 7 days in a row.  If you continue to have problems with postoperative constipation, please contact the office for further assistance and  recommendations.  If you experience "the worst abdominal pain ever" or develop nausea or vomiting, please contact the office immediatly for further recommendations for treatment.  ITCHING  If you experience itching with your medications, try taking only a single pain pill, or even half a pain pill at a time.  You can also use Benadryl over the counter for itching or also to help with sleep.   TED HOSE STOCKINGS Wear the elastic stockings on both legs for three weeks following surgery during the day but you may remove then at night for sleeping.  MEDICATIONS See your medication summary on the "After Visit Summary" that the nursing staff will review with you prior to discharge.  You may have some home medications which will be placed on hold until you complete the course of blood thinner medication.  It  is important for you to complete the blood thinner medication as prescribed by your surgeon.  Continue your approved medications as instructed at time of discharge.  PRECAUTIONS If you experience chest pain or shortness of breath - call 911 immediately for transfer to the hospital emergency department.  If you develop a fever greater that 101 F, purulent drainage from wound, increased redness or drainage from wound, foul odor from the wound/dressing, or calf pain - CONTACT YOUR SURGEON.                                                   FOLLOW-UP APPOINTMENTS Make sure you keep all of your appointments after your operation with your surgeon and caregivers. You should call the office at the above phone number and make an appointment for approximately two weeks after the date of your surgery or on the date instructed by your surgeon outlined in the "After Visit Summary".  RANGE OF MOTION AND STRENGTHENING EXERCISES  These exercises are designed to help you keep full movement of your hip joint. Follow your caregiver's or physical therapist's instructions. Perform all exercises about fifteen times, three  times per day or as directed. Exercise both hips, even if you have had only one joint replacement. These exercises can be done on a training (exercise) mat, on the floor, on a table or on a bed. Use whatever works the best and is most comfortable for you. Use music or television while you are exercising so that the exercises are a pleasant break in your day. This will make your life better with the exercises acting as a break in routine you can look forward to.  Lying on your back, slowly slide your foot toward your buttocks, raising your knee up off the floor. Then slowly slide your foot back down until your leg is straight again.  Lying on your back spread your legs as far apart as you can without causing discomfort.  Lying on your side, raise your upper leg and foot straight up from the floor as far as is comfortable. Slowly lower the leg and repeat.  Lying on your back, tighten up the muscle in the front of your thigh (quadriceps muscles). You can do this by keeping your leg straight and trying to raise your heel off the floor. This helps strengthen the largest muscle supporting your knee.  Lying on your back, tighten up the muscles of your buttocks both with the legs straight and with the knee bent at a comfortable angle while keeping your heel on the floor.   IF YOU ARE TRANSFERRED TO A SKILLED REHAB FACILITY If the patient is transferred to a skilled rehab facility following release from the hospital, a list of the current medications will be sent to the facility for the patient to continue.  When discharged from the skilled rehab facility, please have the facility set up the patient's [place] prior to being released. Also, the skilled facility will be responsible for providing the patient with their medications at time of release from the facility to include their pain medication, the muscle relaxants, and their blood thinner medication. If the patient is still at the rehab  facility at time of the two week follow up appointment, the skilled rehab facility will also need to assist the patient in arranging follow up appointment  in our office and any transportation needs.  MAKE SURE YOU:  Understand these instructions.  Get help right away if you are not doing well or get worse.    Pick up stool softner and laxative for home use following surgery while on pain medications. Do not submerge incision under water. Please use good hand washing techniques while changing dressing each day. May shower starting three days after surgery. Please use a clean towel to pat the incision dry following showers. Continue to use ice for pain and swelling after surgery. Do not use any lotions or creams on the incision until instructed by your surgeon.   Do not sit on low chairs, stoools or toilet seats, as it may be difficult to get up from low surfaces   Complete by: As directed    Driving restrictions   Complete by: As directed    No driving for two weeks   TED hose   Complete by: As directed    Use stockings (TED hose) for three weeks on both leg(s).  You may remove them at night for sleeping.   Weight bearing as tolerated   Complete by: As directed      Allergies as of 12/15/2018   No Known Allergies     Medication List    TAKE these medications   aspirin 325 MG EC tablet Take 1 tablet (325 mg total) by mouth 2 (two) times daily for 20 days. Then take one 81 mg aspirin once a day for three weeks. Then discontinue aspirin.   HYDROcodone-acetaminophen 5-325 MG tablet Commonly known as: NORCO/VICODIN Take 1-2 tablets by mouth every 6 (six) hours as needed for severe pain.   methocarbamol 500 MG tablet Commonly known as: ROBAXIN Take 1 tablet (500 mg total) by mouth every 6 (six) hours as needed for muscle spasms.   traMADol 50 MG tablet Commonly known as: ULTRAM Take 1-2 tablets (50-100 mg total) by mouth every 6 (six) hours as needed for moderate pain.             Discharge Care Instructions  (From admission, onward)         Start     Ordered   12/15/18 0000  Weight bearing as tolerated     12/15/18 0742   12/15/18 0000  Change dressing    Comments: You may change your dressing on Friday, then change the dressing daily with sterile 4 x 4 inch gauze dressing and paper tape.   12/15/18 0742         Follow-up Information    Gaynelle Arabian, MD. Schedule an appointment as soon as possible for a visit on 12/27/2018.   Specialty: Orthopedic Surgery Contact information: [address] [place] [address] [phone]           Signed: Theresa Duty, PA-C Orthopedic Surgery 12/19/2018, 9:15 AM

## 2019-01-17 DIAGNOSIS — Z96642 Presence of left artificial hip joint: Secondary | ICD-10-CM | POA: Diagnosis not present

## 2019-01-17 DIAGNOSIS — Z471 Aftercare following joint replacement surgery: Secondary | ICD-10-CM | POA: Diagnosis not present

## 2019-02-09 DIAGNOSIS — Z20828 Contact with and (suspected) exposure to other viral communicable diseases: Secondary | ICD-10-CM | POA: Diagnosis not present

## 2019-05-31 DIAGNOSIS — Z20828 Contact with and (suspected) exposure to other viral communicable diseases: Secondary | ICD-10-CM | POA: Diagnosis not present

## 2019-06-05 DIAGNOSIS — Z23 Encounter for immunization: Secondary | ICD-10-CM | POA: Diagnosis not present

## 2019-07-03 DIAGNOSIS — Z23 Encounter for immunization: Secondary | ICD-10-CM | POA: Diagnosis not present

## 2020-01-02 DIAGNOSIS — Z23 Encounter for immunization: Secondary | ICD-10-CM | POA: Diagnosis not present

## 2020-03-08 DIAGNOSIS — Z0000 Encounter for general adult medical examination without abnormal findings: Secondary | ICD-10-CM | POA: Diagnosis not present

## 2020-03-08 DIAGNOSIS — Z1389 Encounter for screening for other disorder: Secondary | ICD-10-CM | POA: Diagnosis not present

## 2020-03-08 DIAGNOSIS — Z23 Encounter for immunization: Secondary | ICD-10-CM | POA: Diagnosis not present

## 2020-07-19 DIAGNOSIS — M1712 Unilateral primary osteoarthritis, left knee: Secondary | ICD-10-CM | POA: Diagnosis not present

## 2020-09-23 DIAGNOSIS — L570 Actinic keratosis: Secondary | ICD-10-CM | POA: Diagnosis not present

## 2020-09-23 DIAGNOSIS — M1712 Unilateral primary osteoarthritis, left knee: Secondary | ICD-10-CM | POA: Diagnosis not present

## 2020-09-23 DIAGNOSIS — L814 Other melanin hyperpigmentation: Secondary | ICD-10-CM | POA: Diagnosis not present

## 2020-09-23 DIAGNOSIS — M170 Bilateral primary osteoarthritis of knee: Secondary | ICD-10-CM | POA: Diagnosis not present

## 2020-10-01 DIAGNOSIS — Z23 Encounter for immunization: Secondary | ICD-10-CM | POA: Diagnosis not present

## 2021-01-08 DIAGNOSIS — D1722 Benign lipomatous neoplasm of skin and subcutaneous tissue of left arm: Secondary | ICD-10-CM | POA: Diagnosis not present

## 2021-01-08 DIAGNOSIS — L905 Scar conditions and fibrosis of skin: Secondary | ICD-10-CM | POA: Diagnosis not present

## 2021-01-08 DIAGNOSIS — D1724 Benign lipomatous neoplasm of skin and subcutaneous tissue of left leg: Secondary | ICD-10-CM | POA: Diagnosis not present

## 2021-01-08 DIAGNOSIS — L570 Actinic keratosis: Secondary | ICD-10-CM | POA: Diagnosis not present

## 2021-01-08 DIAGNOSIS — Z85828 Personal history of other malignant neoplasm of skin: Secondary | ICD-10-CM | POA: Diagnosis not present

## 2021-01-08 DIAGNOSIS — L821 Other seborrheic keratosis: Secondary | ICD-10-CM | POA: Diagnosis not present

## 2021-01-08 DIAGNOSIS — D235 Other benign neoplasm of skin of trunk: Secondary | ICD-10-CM | POA: Diagnosis not present

## 2021-01-08 DIAGNOSIS — L989 Disorder of the skin and subcutaneous tissue, unspecified: Secondary | ICD-10-CM | POA: Diagnosis not present

## 2021-01-27 DIAGNOSIS — Z23 Encounter for immunization: Secondary | ICD-10-CM | POA: Diagnosis not present

## 2021-01-29 DIAGNOSIS — M25562 Pain in left knee: Secondary | ICD-10-CM | POA: Diagnosis not present

## 2021-04-15 DIAGNOSIS — M1712 Unilateral primary osteoarthritis, left knee: Secondary | ICD-10-CM | POA: Diagnosis not present

## 2021-06-18 IMAGING — DX PORTABLE PELVIS 1-2 VIEWS
1 series · 1 of 1 positions shown · non-contrast
Comparison: September 28, 2014

CLINICAL DATA: Status post total hip arthroplasty on the left

EXAM:
PORTABLE PELVIS 1-2 VIEWS

[pelvis ap]
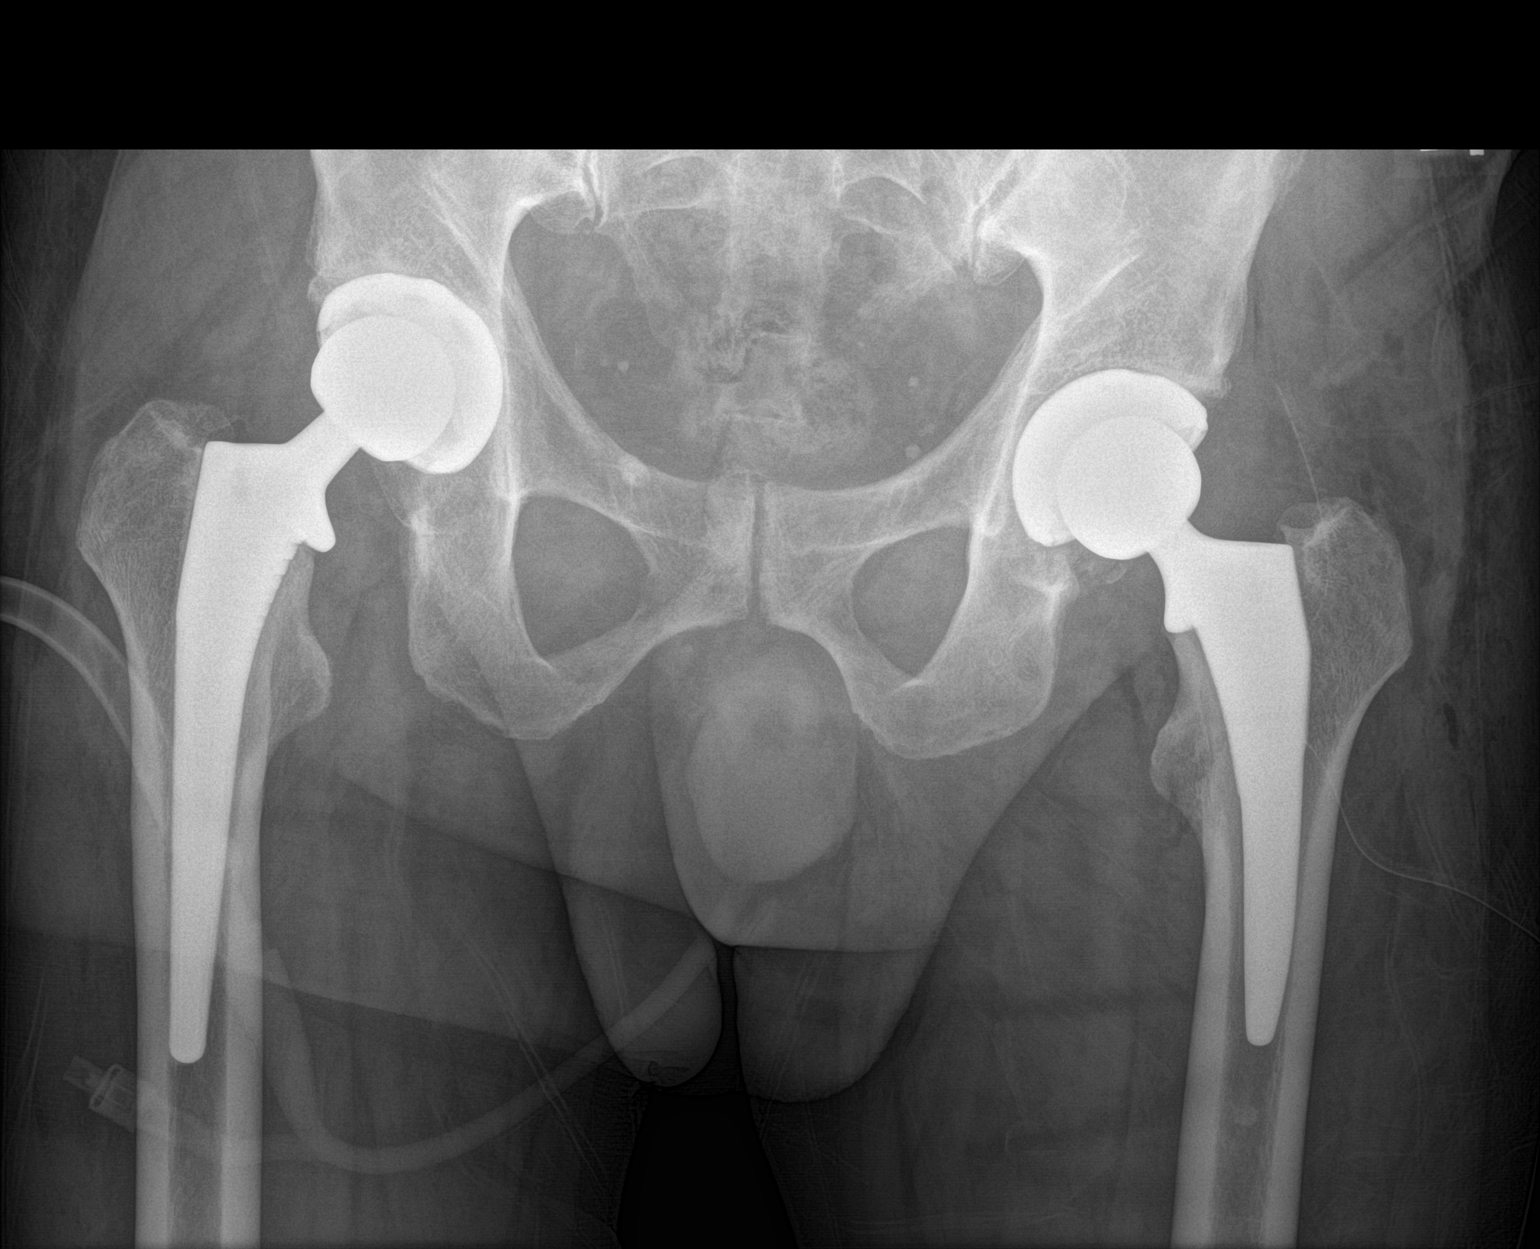

[1 of 1 positions shown; findings below may reference images not displayed]

FINDINGS: There are expected postsurgical changes related to total hip
arthroplasty on the left. Those changes include overlying
subcutaneous gas and soft tissue edema. A surgical drain is noted.
The osseous alignment appears near anatomic. The patient is status
post remote total hip arthroplasty on the left. Phleboliths project
over the patient's pelvis.
IMPRESSION: Expected postsurgical changes related to total hip arthroplasty on
the left.

## 2021-06-18 IMAGING — RF OPERATIVE LEFT HIP WITH PELVIS
1 series · 2 of 2 positions shown · non-contrast
Comparison: September 28, 2014.

CLINICAL DATA: Left hip arthroplasty

EXAM:
OPERATIVE LEFT HIP (WITH PELVIS IF PERFORMED) 2 VIEWS
TECHNIQUE: Fluoroscopic spot image(s) were submitted for interpretation
post-operatively.

[Series 1: run · 2 of 2 slices shown]
[im 1/2]
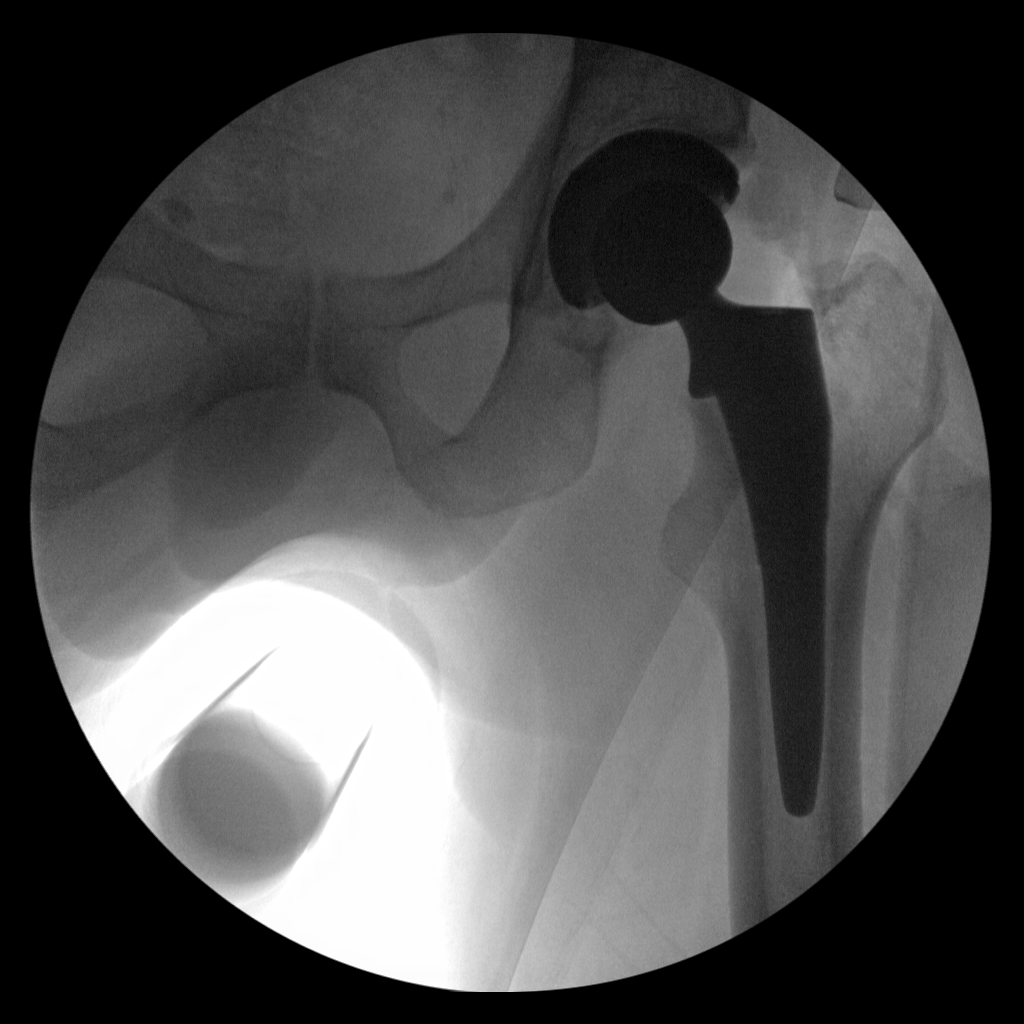
[im 2/2]
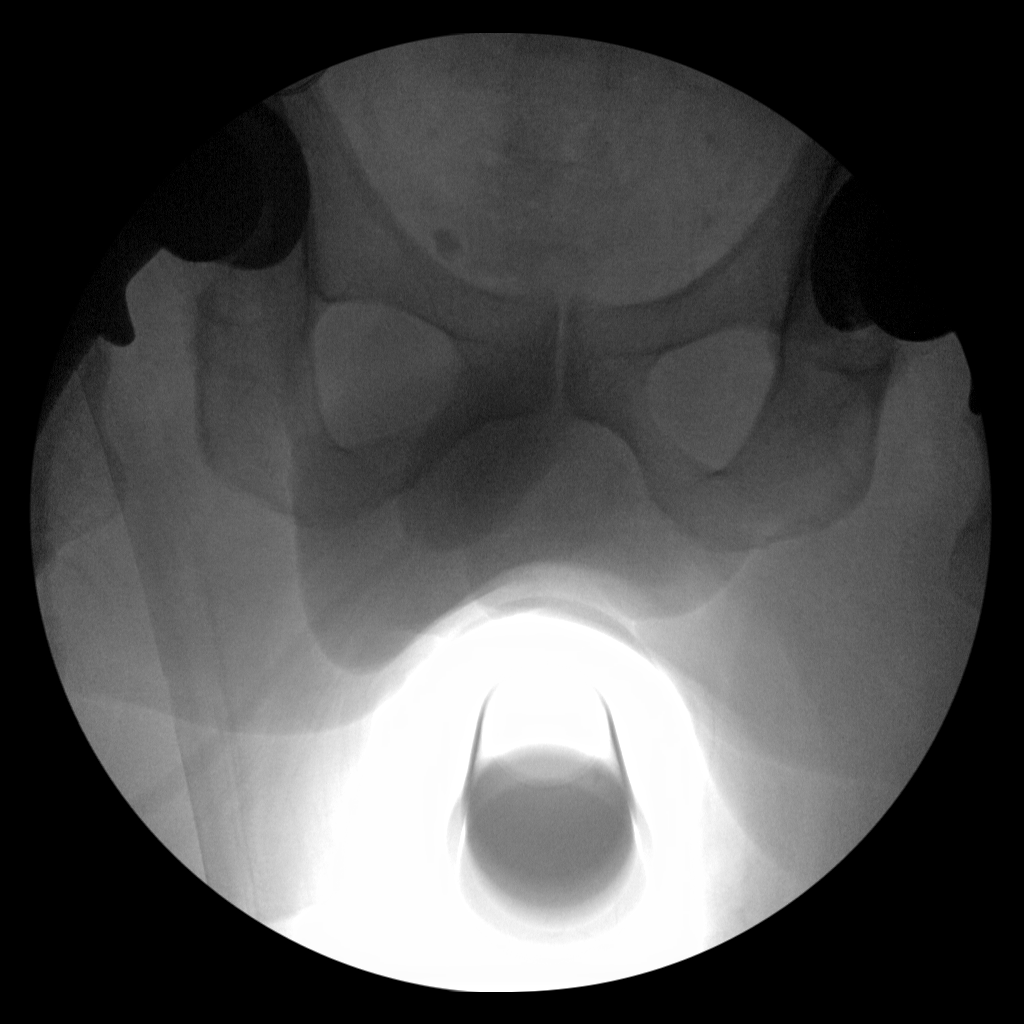

[2 of 2 positions shown; findings below may reference images not displayed]

FINDINGS: The patient has undergone total hip arthroplasty on the left. The
alignment appears near anatomic. A total of 2 images were saved. The
total fluoroscopy time was 18 seconds.
IMPRESSION: Status post total hip arthroplasty on the left.

## 2021-07-11 ENCOUNTER — Other Ambulatory Visit: Payer: Self-pay | Admitting: Urology

## 2021-07-11 DIAGNOSIS — R972 Elevated prostate specific antigen [PSA]: Secondary | ICD-10-CM

## 2021-08-07 ENCOUNTER — Other Ambulatory Visit (HOSPITAL_COMMUNITY): Payer: Self-pay | Admitting: Urology

## 2021-08-07 DIAGNOSIS — R972 Elevated prostate specific antigen [PSA]: Secondary | ICD-10-CM

## 2021-09-02 ENCOUNTER — Ambulatory Visit (HOSPITAL_COMMUNITY)
Admission: RE | Admit: 2021-09-02 | Discharge: 2021-09-02 | Disposition: A | Discharge status: Home / Self Care | Payer: Medicare Other | Source: Ambulatory Visit | Attending: Urology | Admitting: Urology

## 2021-09-02 DIAGNOSIS — R972 Elevated prostate specific antigen [PSA]: Secondary | ICD-10-CM | POA: Diagnosis present

## 2021-09-02 MED ORDER — GADOBUTROL 1 MMOL/ML IV SOLN
9.0000 mL | Freq: Once | INTRAVENOUS | Status: AC | PRN
  Administered 2021-09-02: 9 mL via INTRAVENOUS

## 2022-06-29 DIAGNOSIS — M25562 Pain in left knee: Secondary | ICD-10-CM | POA: Diagnosis not present

## 2022-06-29 DIAGNOSIS — Z96652 Presence of left artificial knee joint: Secondary | ICD-10-CM | POA: Diagnosis not present

## 2022-08-04 DIAGNOSIS — L570 Actinic keratosis: Secondary | ICD-10-CM | POA: Diagnosis not present

## 2022-08-04 DIAGNOSIS — L905 Scar conditions and fibrosis of skin: Secondary | ICD-10-CM | POA: Diagnosis not present

## 2022-08-04 DIAGNOSIS — Z85828 Personal history of other malignant neoplasm of skin: Secondary | ICD-10-CM | POA: Diagnosis not present

## 2022-08-04 DIAGNOSIS — L814 Other melanin hyperpigmentation: Secondary | ICD-10-CM | POA: Diagnosis not present

## 2022-08-04 DIAGNOSIS — D2371 Other benign neoplasm of skin of right lower limb, including hip: Secondary | ICD-10-CM | POA: Diagnosis not present

## 2022-08-04 DIAGNOSIS — D235 Other benign neoplasm of skin of trunk: Secondary | ICD-10-CM | POA: Diagnosis not present

## 2022-09-24 DIAGNOSIS — M25562 Pain in left knee: Secondary | ICD-10-CM | POA: Diagnosis not present

## 2022-09-24 DIAGNOSIS — Z96652 Presence of left artificial knee joint: Secondary | ICD-10-CM | POA: Diagnosis not present

## 2022-10-07 DIAGNOSIS — L570 Actinic keratosis: Secondary | ICD-10-CM | POA: Diagnosis not present

## 2022-10-23 DIAGNOSIS — H35032 Hypertensive retinopathy, left eye: Secondary | ICD-10-CM | POA: Diagnosis not present

## 2022-10-26 DIAGNOSIS — H35032 Hypertensive retinopathy, left eye: Secondary | ICD-10-CM | POA: Diagnosis not present

## 2022-10-27 DIAGNOSIS — I10 Essential (primary) hypertension: Secondary | ICD-10-CM | POA: Diagnosis not present

## 2022-10-27 DIAGNOSIS — Z87898 Personal history of other specified conditions: Secondary | ICD-10-CM | POA: Diagnosis not present

## 2022-10-27 DIAGNOSIS — E785 Hyperlipidemia, unspecified: Secondary | ICD-10-CM | POA: Diagnosis not present

## 2022-10-27 DIAGNOSIS — Z1330 Encounter for screening examination for mental health and behavioral disorders, unspecified: Secondary | ICD-10-CM | POA: Diagnosis not present

## 2022-11-06 DIAGNOSIS — H3562 Retinal hemorrhage, left eye: Secondary | ICD-10-CM | POA: Diagnosis not present

## 2022-11-06 DIAGNOSIS — H35032 Hypertensive retinopathy, left eye: Secondary | ICD-10-CM | POA: Diagnosis not present

## 2022-11-06 DIAGNOSIS — H5319 Other subjective visual disturbances: Secondary | ICD-10-CM | POA: Diagnosis not present

## 2022-12-16 DIAGNOSIS — L821 Other seborrheic keratosis: Secondary | ICD-10-CM | POA: Diagnosis not present

## 2022-12-16 DIAGNOSIS — D485 Neoplasm of uncertain behavior of skin: Secondary | ICD-10-CM | POA: Diagnosis not present

## 2023-01-20 DIAGNOSIS — Z87898 Personal history of other specified conditions: Secondary | ICD-10-CM | POA: Diagnosis not present

## 2023-01-20 DIAGNOSIS — R748 Abnormal levels of other serum enzymes: Secondary | ICD-10-CM | POA: Diagnosis not present

## 2023-01-20 DIAGNOSIS — Z0000 Encounter for general adult medical examination without abnormal findings: Secondary | ICD-10-CM | POA: Diagnosis not present

## 2023-01-20 DIAGNOSIS — E785 Hyperlipidemia, unspecified: Secondary | ICD-10-CM | POA: Diagnosis not present

## 2023-01-20 DIAGNOSIS — I10 Essential (primary) hypertension: Secondary | ICD-10-CM | POA: Diagnosis not present

## 2023-02-16 DIAGNOSIS — R972 Elevated prostate specific antigen [PSA]: Secondary | ICD-10-CM | POA: Diagnosis not present

## 2023-03-01 DIAGNOSIS — R972 Elevated prostate specific antigen [PSA]: Secondary | ICD-10-CM | POA: Diagnosis not present

## 2023-03-01 DIAGNOSIS — N401 Enlarged prostate with lower urinary tract symptoms: Secondary | ICD-10-CM | POA: Diagnosis not present

## 2023-03-01 DIAGNOSIS — R351 Nocturia: Secondary | ICD-10-CM | POA: Diagnosis not present

## 2023-08-05 DIAGNOSIS — D3614 Benign neoplasm of peripheral nerves and autonomic nervous system of thorax: Secondary | ICD-10-CM | POA: Diagnosis not present

## 2023-08-05 DIAGNOSIS — D485 Neoplasm of uncertain behavior of skin: Secondary | ICD-10-CM | POA: Diagnosis not present

## 2023-08-05 DIAGNOSIS — L570 Actinic keratosis: Secondary | ICD-10-CM | POA: Diagnosis not present

## 2023-08-05 DIAGNOSIS — L821 Other seborrheic keratosis: Secondary | ICD-10-CM | POA: Diagnosis not present

## 2023-08-31 DIAGNOSIS — D485 Neoplasm of uncertain behavior of skin: Secondary | ICD-10-CM | POA: Diagnosis not present

## 2023-08-31 DIAGNOSIS — L570 Actinic keratosis: Secondary | ICD-10-CM | POA: Diagnosis not present

## 2023-11-09 DIAGNOSIS — H35032 Hypertensive retinopathy, left eye: Secondary | ICD-10-CM | POA: Diagnosis not present

## 2023-11-09 DIAGNOSIS — H5319 Other subjective visual disturbances: Secondary | ICD-10-CM | POA: Diagnosis not present

## 2023-11-09 DIAGNOSIS — H3562 Retinal hemorrhage, left eye: Secondary | ICD-10-CM | POA: Diagnosis not present

## 2023-11-09 DIAGNOSIS — H524 Presbyopia: Secondary | ICD-10-CM | POA: Diagnosis not present

## 2023-11-18 DIAGNOSIS — L570 Actinic keratosis: Secondary | ICD-10-CM | POA: Diagnosis not present

## 2023-11-18 DIAGNOSIS — L821 Other seborrheic keratosis: Secondary | ICD-10-CM | POA: Diagnosis not present

## 2023-11-18 DIAGNOSIS — L820 Inflamed seborrheic keratosis: Secondary | ICD-10-CM | POA: Diagnosis not present

## 2024-03-07 IMAGING — MR MR PROSTATE WO/W CM
13 series · 48 of 48 positions shown · IV contrast (10 GADAVIST)
Comparison: None Available.

CLINICAL DATA: Elevated PSA level.

EXAM:
MR PROSTATE WITHOUT AND WITH CONTRAST
TECHNIQUE: Multiplanar multisequence MRI images were obtained of the pelvis
centered about the prostate. Pre and post contrast images were
obtained.
CONTRAST:  9mL GADAVIST GADOBUTROL 1 MMOL/ML IV SOLN

[Series 3: T1 · axial · 5.0mm · 1.19mm/px · 1 of 64 slices shown (1 of 2)]
[im 1/64]
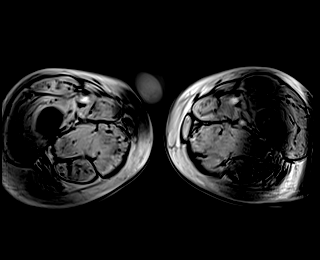

[Series 4: T1 · axial · 5.0mm · 1.19mm/px · 1 of 64 slices shown (2 of 2)]
[im 1/64]
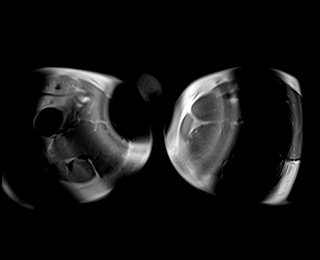

[Series 5: T2 · axial · 3.0mm · 0.47mm/px · 1 of 34 slices shown (1 of 4)]
[im 1/34]
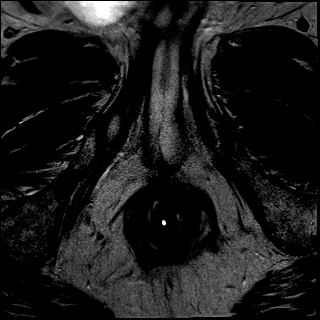

[Series 6: T2 · coronal · 3.0mm · 0.47mm/px · 1 of 28 slices shown (2 of 4)]
[im 1/28]
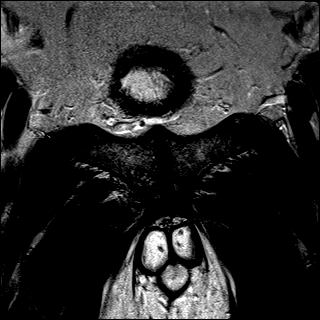

[Series 7: T2 · axial · 1.0mm · 1.00mm/px · z∈[-3,+92]mm · 2 of 96 slices shown (3 of 4)]
[im 1/96]
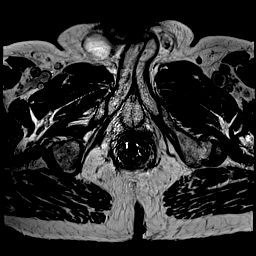
[im 96/96]
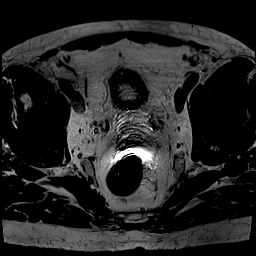

[Series 8: ep2d_diff_[id number]_800_tra_endo**_tracew_dfc_mix · axial · 3.0mm · 1.60mm/px · z∈[-11,+93]mm · 2 of 108 slices shown]
[im 1/108]
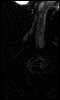
[im 108/108]
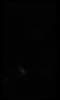

[Series 9: ep2d_diff_b100_500_800_tra_endo**_adc_dfc_mix · axial · 3.0mm · 1.60mm/px · 1 of 36 slices shown]
[im 1/36]
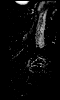

[Series 10: ep2d_diff_b100_500_800_tra_endo**_calc_bval_dfc_mix · axial · 3.0mm · 1.60mm/px · 1 of 36 slices shown]
[im 1/36]
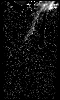

[Series 11: T2 · axial · 3.0mm · 0.47mm/px · 1 of 34 slices shown (4 of 4)]
[im 1/34]
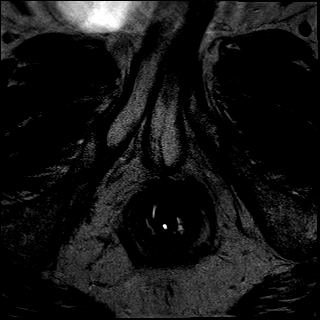

[Series 12: ep2d_diff_bvalue (id) · axial · 3.0mm · 1.60mm/px · 1 of 34 slices shown]
[im 1/34]
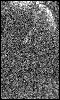

[Series 13: axial multiphase · axial · 3.0mm · 0.98mm/px · z∈[-24,+92]mm · 17 of 789 slices shown]
[im 1/789]
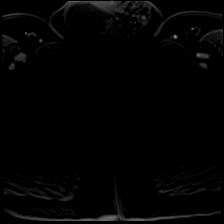
[im 50/789]
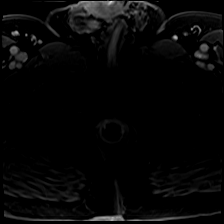
[im 99/789]
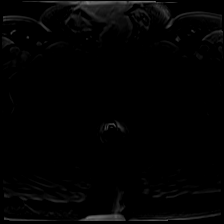
[im 148/789]
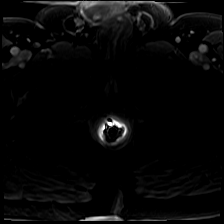
[im 198/789]
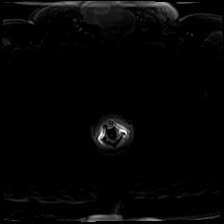
[im 247/789]
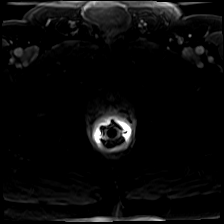
[im 296/789]
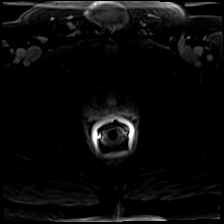
[im 345/789]
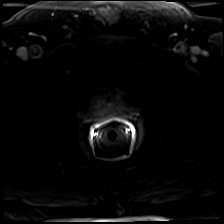
[im 395/789]
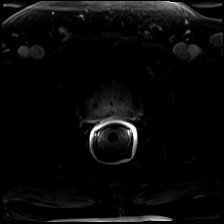
[im 444/789]
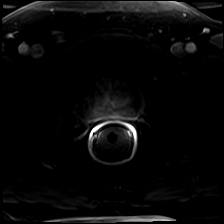
[im 493/789]
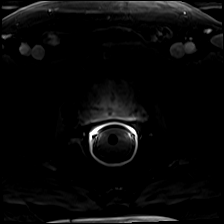
[im 542/789]
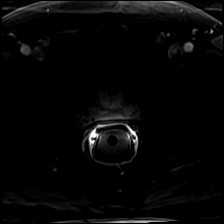
[im 592/789]
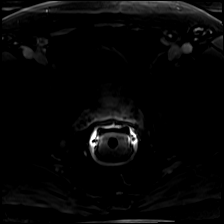
[im 641/789]
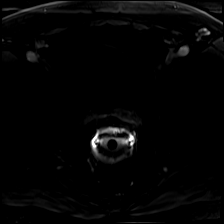
[im 690/789]
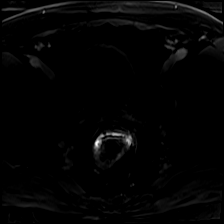
[im 739/789]
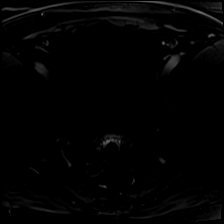
[im 789/789]
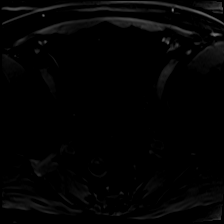

[Series 14: axial multiphase_sub · axial · 3.0mm · 0.98mm/px · z∈[-24,+92]mm · 16 of 760 slices shown]
[im 1/760]
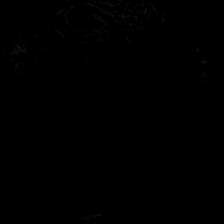
[im 51/760]
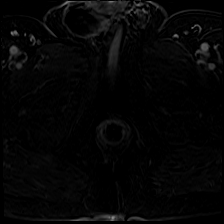
[im 102/760]
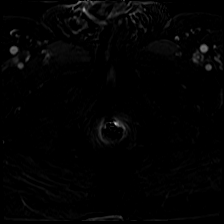
[im 152/760]
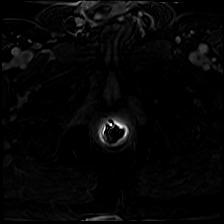
[im 203/760]
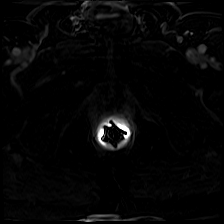
[im 254/760]
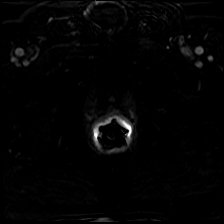
[im 304/760]
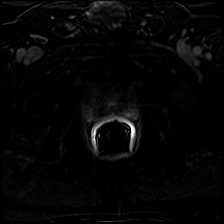
[im 355/760]
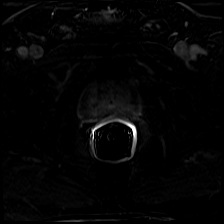
[im 405/760]
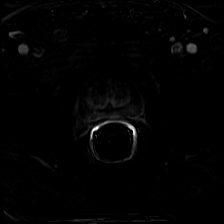
[im 456/760]
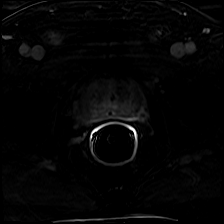
[im 507/760]
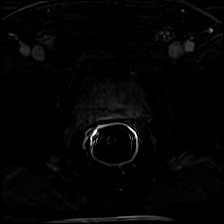
[im 557/760]
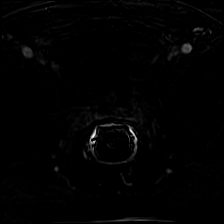
[im 608/760]
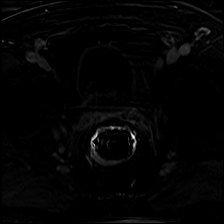
[im 658/760]
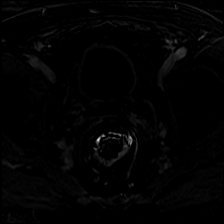
[im 709/760]
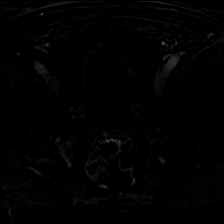
[im 760/760]
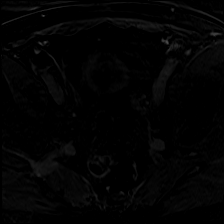

[Series 16: iliac crest thru · axial · 2.5mm · 1.19mm/px · z∈[-96,+222]mm · 3 of 128 slices shown]
[im 1/128]
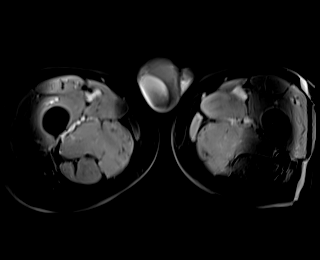
[im 64/128]
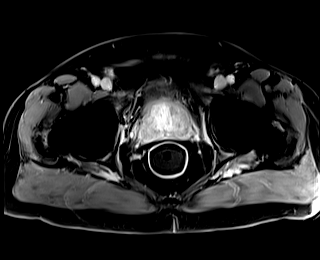
[im 128/128]
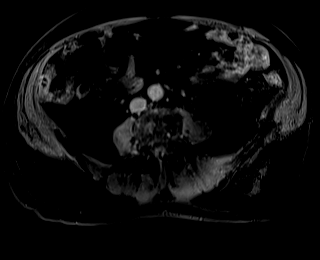

[48 of 48 positions shown; findings below may reference images not displayed]

FINDINGS: Metal artifact from bilateral hip implants noted, which introduces
artifact particularly into the diffusion weighted images. Today's
exam was performed using a rectal coil.

Prostate:

Encapsulated nodularity in the transition zone compatible with
benign prostatic hypertrophy. There is some linear bandlike low T2
signal elements in the peripheral zone without well-defined focal
reduction in ADC map activity, substantial restricted diffusion, or
substantial focal or early enhancement. No specific lesion of
intermediate or higher suspicion for prostate cancer is identified.

Volume: 3D volumetric analysis: Prostate volume 89.54 cc (6.5 by
by 5.7 cm).

Transcapsular spread:  Absent

Seminal vesicle involvement: Absent

Neurovascular bundle involvement: Absent

Pelvic adenopathy: Absent

Bone metastasis: Absent

Other findings: Mild sigmoid colon diverticulosis. Right scrotal
hydrocele.
IMPRESSION: 1. No MRI findings of intermediate or higher suspicion for prostate
cancer identified.
2. Prostatomegaly and benign prostatic hypertrophy.
3. Mild sigmoid colon diverticulosis.
4. Right scrotal hydrocele.

## 8387-01-03 DEATH — deceased
